# Patient Record
Sex: Female | Born: 1955 | Race: White | Hispanic: No | Marital: Married | Smoking: Former smoker
Health system: Southern US, Community
[De-identification: ages and names within clinical notes are randomized; demographics above are authoritative.]

## PROBLEM LIST (undated history)

## (undated) DIAGNOSIS — Z9889 Other specified postprocedural states: Secondary | ICD-10-CM

## (undated) DIAGNOSIS — M1990 Unspecified osteoarthritis, unspecified site: Secondary | ICD-10-CM

## (undated) DIAGNOSIS — R112 Nausea with vomiting, unspecified: Secondary | ICD-10-CM

## (undated) DIAGNOSIS — H04129 Dry eye syndrome of unspecified lacrimal gland: Secondary | ICD-10-CM

## (undated) DIAGNOSIS — Z87442 Personal history of urinary calculi: Secondary | ICD-10-CM

## (undated) DIAGNOSIS — I351 Nonrheumatic aortic (valve) insufficiency: Secondary | ICD-10-CM

## (undated) DIAGNOSIS — I2510 Atherosclerotic heart disease of native coronary artery without angina pectoris: Secondary | ICD-10-CM

## (undated) DIAGNOSIS — R011 Cardiac murmur, unspecified: Secondary | ICD-10-CM

## (undated) HISTORY — DX: Atherosclerotic heart disease of native coronary artery without angina pectoris: I25.10

## (undated) HISTORY — PX: WISDOM TOOTH EXTRACTION: SHX21

## (undated) HISTORY — PX: COLONOSCOPY: SHX174

---

## 1994-11-25 HISTORY — PX: SPLENECTOMY, TOTAL: SHX788

## 1994-11-25 HISTORY — PX: ABDOMINAL SURGERY: SHX537

## 2000-07-03 ENCOUNTER — Other Ambulatory Visit: Admission: RE | Admit: 2000-07-03 | Discharge: 2000-07-03 | Payer: Self-pay | Admitting: Obstetrics and Gynecology

## 2001-08-13 ENCOUNTER — Other Ambulatory Visit: Admission: RE | Admit: 2001-08-13 | Discharge: 2001-08-13 | Payer: Self-pay | Admitting: Obstetrics and Gynecology

## 2002-11-25 HISTORY — PX: ABDOMINAL HYSTERECTOMY: SHX81

## 2003-10-31 ENCOUNTER — Encounter: Admission: RE | Admit: 2003-10-31 | Discharge: 2003-10-31 | Payer: Self-pay | Admitting: Family Medicine

## 2003-11-07 ENCOUNTER — Other Ambulatory Visit: Admission: RE | Admit: 2003-11-07 | Discharge: 2003-11-07 | Payer: Self-pay | Admitting: Obstetrics and Gynecology

## 2003-11-24 ENCOUNTER — Encounter: Admission: RE | Admit: 2003-11-24 | Discharge: 2003-11-24 | Payer: Self-pay | Admitting: Orthopaedic Surgery

## 2005-01-22 ENCOUNTER — Other Ambulatory Visit: Admission: RE | Admit: 2005-01-22 | Discharge: 2005-01-22 | Payer: Self-pay | Admitting: Obstetrics and Gynecology

## 2005-06-11 ENCOUNTER — Observation Stay (HOSPITAL_COMMUNITY): Admission: RE | Admit: 2005-06-11 | Discharge: 2005-06-12 | Payer: Self-pay | Admitting: Obstetrics and Gynecology

## 2005-06-11 ENCOUNTER — Encounter (INDEPENDENT_AMBULATORY_CARE_PROVIDER_SITE_OTHER): Payer: Self-pay | Admitting: Specialist

## 2006-01-15 ENCOUNTER — Ambulatory Visit (HOSPITAL_COMMUNITY): Admission: RE | Admit: 2006-01-15 | Discharge: 2006-01-15 | Payer: Self-pay | Admitting: Chiropractic Medicine

## 2006-01-30 ENCOUNTER — Other Ambulatory Visit: Admission: RE | Admit: 2006-01-30 | Discharge: 2006-01-30 | Payer: Self-pay | Admitting: Obstetrics and Gynecology

## 2010-03-06 ENCOUNTER — Ambulatory Visit: Payer: Self-pay | Admitting: Cardiology

## 2010-03-06 DIAGNOSIS — R079 Chest pain, unspecified: Secondary | ICD-10-CM

## 2010-03-06 DIAGNOSIS — R011 Cardiac murmur, unspecified: Secondary | ICD-10-CM

## 2010-03-13 ENCOUNTER — Ambulatory Visit (HOSPITAL_COMMUNITY): Admission: RE | Admit: 2010-03-13 | Discharge: 2010-03-13 | Payer: Self-pay | Admitting: Cardiology

## 2010-03-13 ENCOUNTER — Encounter: Payer: Self-pay | Admitting: Cardiology

## 2010-03-13 ENCOUNTER — Ambulatory Visit: Payer: Self-pay

## 2010-03-13 ENCOUNTER — Ambulatory Visit: Payer: Self-pay | Admitting: Cardiovascular Disease

## 2010-12-25 NOTE — Assessment & Plan Note (Signed)
Summary: np6/ chestpain.sob; pt has bcbs/ gd   Visit Type:  new pt visit Referring Provider:  Dr. Arelia Sneddon Primary Provider:  No PCP at this time  CC:  LUQ cp....states pain is a squeezing feeling...pt also states he left great toe turns gray once in awhile..denies any sob or edema..  History of Present Illness: Mrs Levier is a 55 year old married white female referred by Dr. Arelia Sneddon for chest pain and palpitations.  She describes the chest discomfort is well localized over her left breast. It happened spontaneously is not related to exertion. It does not radiate to the neck jaw shoulder or arm. It is not associated with any nausea vomiting diaphoresis shortness of breath. It is not made worse by taking a deep breath.  She denies any gastroesophageal reflux or esophageal discomfort. She's had no dysphagia. She denies any fever, chills, cough, hemoptysis. She's had no chest trauma.  She has very few cardiac risk factors mostly remote tobacco and increasing age.  Current Medications (verified): 1)  Vitamin E 400 Unit Caps (Vitamin E) .Marland Kitchen.. 1 Cap Once Daily 2)  Zinc 25 Mg Tabs (Zinc) .Marland Kitchen.. 1 Tab Once Daily 3)  Multivitamins   Tabs (Multiple Vitamin) .Marland Kitchen.. 1 Tab Once Daily 4)  Instaflex- Joint Health .... 2 Caps Once Daily  Allergies (verified): 1)  ! Bonney Aid  Past History:  Past Medical History: CHEST PAIN (ICD-786.50)  Past Surgical History: hysterectomy..2009 mass removed from abdomen..1996  Review of Systems       negative other than history of present illness  Vital Signs:  Patient profile:   55 year old female Height:      65 inches Weight:      128 pounds BMI:     21.38 Pulse rate:   75 / minute Pulse rhythm:   irregular BP sitting:   138 / 82  (left arm) Cuff size:   large  Vitals Entered By: Danielle Rankin, CMA (March 06, 2010 11:54 AM)  Physical Exam  General:  Well developed, well nourished, in no acute distress. Head:  normocephalic and atraumatic Eyes:   PERRLA/EOM intact; conjunctiva and lids normal. Neck:  Neck supple, no JVD. No masses, thyromegaly or abnormal cervical nodes. Chest Jameis [place]sh[place]:  no deformities or breast masses noted Lungs:  Clear bilaterally to auscultation and percussion. Heart:  regular rate and rhythm, PMI nondisplaced, normal S1-S2, question click, soft systolic murmur at the apex. Abdomen:  Bowel sounds positive; abdomen soft and non-tender without masses, organomegaly, or hernias noted. No hepatosplenomegaly. Msk:  Back normal, normal gait. Muscle strength and tone normal. Pulses:  pulses normal in all 4 extremities Extremities:  No clubbing or cyanosis. Neurologic:  Alert and oriented x 3. Skin:  Intact without lesions or rashes. Psych:  Normal affect.   Problems:  Medical Problems Added: 1)  Dx of Cardiac Murmur  (ICD-785.2) 2)  Dx of Chest Pain  (ICD-786.50)  Impression & Recommendations:  Problem # 1:  CHEST PAIN (ICD-786.50) Assessment New This is clearly noncoronary. It could be related to possible prolapse would rule out by an echocardiogram. Her risk factors are are nominal and I reviewed those with her today. Statistical risk based on national data bases is less than 2% of having an acute coronary event in the next 10 years. Orders: EKG w/ Interpretation (93000) Echocardiogram (Echo)  Problem # 2:  CARDIAC MURMUR (ICD-785.2) I will obtain a 2-D echocardiogram rule out mitral valve prolapse. It showed on the model today what this might be and  could be associated for some of her symptomatology. If she has prolapse, has minimal regurg. SBE prophylaxis is not recommended. Reassurance given. Orders: Echocardiogram (Echo)  Patient Instructions: 1)  Your physician recommends that you schedule a follow-up appointment in: AS NEEDED 2)  Your physician recommends that you continue on your current medications as directed. Please refer to the Current Medication list given to you today. 3)  Your physician has  requested that you have an echocardiogram.  Echocardiography is a painless test that uses sound waves to create images of your heart. It provides your doctor with information about the size and shape of your heart and how well your heart's chambers and valves are working.  This procedure takes approximately one hour. There are no restrictions for this procedure.

## 2011-04-12 NOTE — Discharge Summary (Signed)
NAMECLAUDETT, Rachel Fleming                 ACCOUNT NO.:  [id number]   MEDICAL RECORD NO.:  [id number]          PATIENT TYPE:  OBV   LOCATION:  9311                          FACILITY:  [facility]   PHYSICIAN:  Juluis Mire, M.D.   DATE OF BIRTH:  08-May-1956   DATE OF ADMISSION:  06/11/2005  DATE OF DISCHARGE:                                 DISCHARGE SUMMARY   ADMITTING DIAGNOSIS:  Symptomatic uterine fibroids.   DISCHARGE DIAGNOSIS:  Symptomatic uterine fibroids.   OPERATIVE PROCEDURE:  Laparoscopic-assisted hysterectomy.   For complete history and physical please see dictated note.   COURSE IN THE HOSPITAL:  The patient underwent laparoscopic-assisted vaginal  hysterectomy. Postoperatively did have trouble with nausea and vomiting,  continued until the next morning. At that time she was tolerating some  liquids. Her abdomen was soft, bowel sounds were active. Her Foley had been  discontinued. She had voided without difficulty, was having no active  vaginal bleeding. Incisions were clear and abdominal exam was benign. Her  hemoglobin was 11.5.   We are going to observe her through the rest of the morning and see how she  does with her oral intake. If stable at this time will discharge home after  lunch.   COMPLICATIONS:  None encountered during her stay in the hospital. The  patient discharged home in stable condition.   DISPOSITION:  Routine postoperative instructions were given. She is to avoid  heavy lifting, vaginal entrance, or driving a car. Discharge home on  Percocet as she needs it for pain. She is to call with signs of infection,  nausea, vomiting, increase in abdominal pain, or active vaginal bleeding.  Follow-up in the office should be in 1 week.       JSM/MEDQ  D:  06/12/2005  T:  06/12/2005  Job:  [id number]

## 2011-04-12 NOTE — H&P (Signed)
NAME:  Rachel Fleming, Rachel Fleming                 ACCOUNT NO.:  [id number]   MEDICAL RECORD NO.:  [id number]          PATIENT TYPE:  AMB   LOCATION:  SDC                           FACILITY:  [facility]   PHYSICIAN:  Juluis Mire, M.D.   DATE OF BIRTH:  May 06, 1956   DATE OF ADMISSION:  DATE OF DISCHARGE:                                HISTORY & PHYSICAL   The patient is a 55 year old gravida 1, para 0, AB-1, female who presents  for attempt at laparoscopic assisted vaginal hysterectomy for management of  enlarging symptomatic uterine fibroids.   The patient has had a long history of uterine fibroids. They had been  relatively asymptomatic and managed conservatively. However the patient  began to have increasing pelvic pain and discomfort and found that she was  having definite enlargement of uterine fibroids. She had a 4 cm intramural  posterior wall fibroid, and a left fundal apparent pedunculated fibroid  measuring approximately 6.8 to 7 cm. She was having increasing left lower  quadrant discomfort and again the only findings that we could come up with  was the enlarging fibroids. In view of this the patient has decided to  proceed with attempt at surgery with a laparoscopic assisted vaginal  hysterectomy with possible total abdominal hysterectomy. She does wish her  ovaries to be conserved. We have discussed with her the risk of ovarian  cancer of 1 in 60, and the difficulty in detection of this, still she wants  the ovaries to be conserved.   ALLERGIES:  No known drug allergies.   MEDICATIONS:  Birth control pills in form of Ortho-Cyclen.   PAST MEDICAL HISTORY:  Usual childhood diseases.   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 She has had previous laser  ablation of vulvar intraepithelial neoplasia. She has had previous  cryotherapy at the cervix.   FAMILY HISTORY:  Noncontributory.   SOCIAL HISTORY:  Reveals no  tobacco or alcohol use.   REVIEW OF SYSTEMS:  Noncontributory.   PHYSICAL EXAMINATION:  VITAL SIGNS: The patient is afebrile with stable  vital signs.  HEENT EXAM: Normocephalic. Pupils are equal, round and reactive to light and  accommodation. Extraocular movements are intact. Sclerae and conjunctivae  clear. Oropharynx clear.  NECK: Without thyromegaly.  BREASTS:  No discreet masses.  LUNGS:  Clear.  CARDIOVASCULAR:  Regular rate and rhythm without murmurs or gallops.  ABDOMEN:  Midline incision, no masses, organomegaly and minimal tenderness.  PELVIC:  Normal external genitalia. Vaginal mucosa is clear. Cervix is  unremarkable. Uterus is enlarged. This is consistent with the known uterine  fibroids. She has a dominant fibroid on the left that is consisted with the  pedunculated fibroid and seems to be the source of pain. Rectovaginal exam  is clear.  EXTREMITIES:  Trace edema.  NEUROLOGIC EXAM:  Grossly within normal limits.   IMPRESSION:  1.  Enlarging uterine fibroids with associated symptomatology  2.  History of removal of a neural blastoma.   PLAN:  We have discussed the options with the patient.  She has decided on  surgical options. The risks of surgery have been discussed including the  risk of infection. The risk of hemorrhage that could require transfusion,  the risk of AIDS or hepatitis. The risk of injury to adjacent organs  including bladder, bowel, or ureters that could require further exploratory  surgery. The risk of deep venous thrombosis and pulmonary embolus. The  patient does under the potential need for an abdominal incision. In view of  her previous surgery and the large fibroid. We have also discussed again the  risk of malignant transformation if the ovaries are conserved.       JSM/MEDQ  D:  06/11/2005  T:  06/11/2005  Job:  [id number]

## 2011-04-12 NOTE — Op Note (Signed)
Rachel Fleming, Rachel Fleming                 ACCOUNT NO.:  [id number]   MEDICAL RECORD NO.:  [id number]          PATIENT TYPE:  OBV   LOCATION:  9311                          FACILITY:  [facility]   PHYSICIAN:  Juluis Mire, M.D.   DATE OF BIRTH:  1956/04/29   DATE OF PROCEDURE:  06/11/2005  DATE OF DISCHARGE:                                 OPERATIVE REPORT   PREOPERATIVE DIAGNOSIS:  Uterine fibroids.   POSTOPERATIVE DIAGNOSIS:  Uterine fibroids.   OPERATION/PROCEDURE:  Laparoscopic-assisted vaginal hysterectomy.   SURGEON:  Juluis Mire, M.D.   ASSISTANT:  Dineen Kid. Rana Snare, M.D.   ANESTHESIA:  General endotracheal anesthesia.   ESTIMATED BLOOD LOSS:  300-400 mL.   PACKS:  None.   DRAINS:  None.   INTRAOPERATIVE BLOOD REPLACEMENT:  None.   COMPLICATIONS:  None.   INDICATIONS:  As noted in the history and physical.   DESCRIPTION OF PROCEDURE:  The patient was taken to the OR, placed in the  supine position.  After satisfactory level of general endotracheal  anesthesia was obtained, the patient was placed in the dorsal lithotomy  position using stirrups.  The abdomen and perineum and vagina were cleansed  out with a non-banded dye and surgical scrub.  A Foley was placed to  straight drainage.  Adequate amount of clear urine.  Hulka tenaculum was put  in placed and secured.  The patient was then draped in a sterile field.   Subumbilical incision was made with the knife and carried through the  subcutaneous tissue.  Fascia was entered sharply and incision extended  laterally.  Peritoneum was entered bluntly with  a finger.  A taut open  laparoscopic trocar was put in place and secured.  Abdomen was inflated with  carbon dioxide.  A 5 mm trocar was put in place in the suprapubic area under  direct visualization.  Visualization revealed the uterus to have one large  anterior wall fibroid and a large pedunculated fibroid coming off  the left  fundal area.  The right ovary had one spot of  endometriosis.  The left ovary  was unremarkable.  Cul-de-sac was clear.  There was some omental adhesions  to the anterior abdominal wall from the previous exploratory laparotomy to  remove her neuroblastoma.  Upper abdomen including the liver and tip of the  gallbladder and both lateral gutters were clear.   Next, using the Gyrus bipolar cauterized and incised the right utero-ovarian  pedicle, right tube and mesosalpinx and right round ligament.  We continued  the dissection down in the broad ligament.  We then went to left side,  deviating the uterus and elevating it.  We identified the left utero-ovarian  pedicle, cauterizing and incised it.  The left tube and mesosalpinx were  cauterized, incised, the left round ligament was cauterized and incised and  the procedure was continued into the broad ligament.  We then developed a  bladder flap with the Gyrus.  We had good hemostasis and we felt like we had  the uterus freed up adequately to go from below.  The abdomen  was deflated  with carbon dioxide.  Laparoscope was removed.  Legs were repositioned.  Weighted speculum was placed in the vaginal vault.  We attempted to grasp  the cervix with the [facility] tenaculum.  We attempted to enter the cul-de-sac.  We were unsuccessful.  We identified the uterosacral ligaments and clamped,  cut and suture ligated these.  Reflection of the vaginal mucosa anteriorly  was incised using the Bovie and the bladder was dissected superiorly.  Paracervical tissue was clamped, cut and suture ligated with 0 Vicryl.  Cul-  de-sac was then identified, entered sharply.  We further dissected the  bladder superiorly and identified the vesicouterine space.  Using the clamp,  cut and tie technique with a suture ligature of 0 Vicryl, the parametrium  was serially separated from the sides of the uterus.  At this point in time  we encountered the anterior wall fibroid.  We decided to morcellate.  The  fibroid was on the  right side.  We grasped it with a thyroid tenaculum and  excised the cervix and the lower uterine segment.  We then identified the  fibroid and were able to morcellate it out.  With this the uterine fundus  proceeded to come out.  We had two pedicles in each side which were still  intact and these were clamped and cut.  The remaining uterus and the  associated pedunculated fibroids were removed intact and sent for  pathological review.  Held pedicle secured with free tie of 0 Vicryl.  Moistened sponge on a sponge stick was placed vaginally.  Bleeding from the  lower cuff brought under control with figure-of-eights of 0 Vicryl.  Vaginal  cuff was then reapproximated in the midline with interrupted sutures of 0  Vicryl.  We had good hemostasis and clear urine output.  Sponge on sponge  stick was placed in the vaginal vault.  Weighted speculum was then removed.   Abdomen was reinflated with carbon dioxide.  Laparoscope was reintroduced.  Minimal bleeding was noted at the vaginal cuff and brought under control  with the Gyrus.  Both ovarian pedicles were hemostatically intact. Right  ovary had one spot of endometriosis, cauterized.  Ovaries were otherwise  completely unremarkable.  Irrigated the pelvis.  Irrigation was removed.  Again, we had good hemostasis.  Abdomen was deflated with carbon dioxide.  All trocars were removed.  Subumbilical fascia was closed with two figure-of-  eights with 0 Vicryl.  Skin was closed with interrupted subcuticular 4-0  Vicryl.  Suprapubic incision was closed with Dermabond.  Sponge on a sponge  stick was removed from the vaginal vault.  The patient was taken out of the  dorsal lithotomy position, once alert and extubated, transferred to the  recovery room in good condition.  Sponge, instrument and needle counts  reported as correct by the circulating nurse x2.      JSM/MEDQ  D:  06/11/2005  T:  06/11/2005  Job:  [id number]

## 2011-09-24 ENCOUNTER — Telehealth: Payer: Self-pay | Admitting: Cardiology

## 2011-09-24 DIAGNOSIS — R011 Cardiac murmur, unspecified: Secondary | ICD-10-CM

## 2011-09-24 NOTE — Telephone Encounter (Signed)
Pt last seen by Dr Daleen Squibb 4/11 and she did have a 2 D Echo at that time.  Dr Vern Claude note states she will be seen back as needed.  Will forward to MD for review and any orders.

## 2011-09-24 NOTE — Telephone Encounter (Signed)
Pt had test last year and was told to have again this year but not sure what test it was, sounds like an echo, no order, does dr wall want to order? And pt has new insurance, need to send to [place] for precert if pt needs test, [employer] [phone] member # [id number] grp [id number] timothy g Hillock [birth year]

## 2011-09-26 NOTE — Telephone Encounter (Signed)
lmovm billing code for pt to check with her insurance about coverage of echo. I will mail out copy today of form to pt. Mylo Red RN

## 2011-09-26 NOTE — Telephone Encounter (Signed)
I spoke with pt and have scheduled her for an ECHO Dx: AI and follow-up appt with Dr. Daleen Squibb.  This is per Dr. Vern Claude note on 4/11 ECHO.

## 2011-10-02 ENCOUNTER — Other Ambulatory Visit (HOSPITAL_COMMUNITY): Payer: Self-pay | Admitting: Radiology

## 2011-10-11 ENCOUNTER — Ambulatory Visit: Payer: Self-pay | Admitting: Cardiology

## 2011-11-13 ENCOUNTER — Other Ambulatory Visit: Payer: Self-pay | Admitting: Dermatology

## 2011-12-05 ENCOUNTER — Ambulatory Visit (HOSPITAL_COMMUNITY): Payer: Managed Care, Other (non HMO) | Attending: Cardiology | Admitting: Radiology

## 2011-12-05 DIAGNOSIS — R011 Cardiac murmur, unspecified: Secondary | ICD-10-CM | POA: Insufficient documentation

## 2011-12-05 DIAGNOSIS — R079 Chest pain, unspecified: Secondary | ICD-10-CM | POA: Insufficient documentation

## 2011-12-11 ENCOUNTER — Encounter: Payer: Self-pay | Admitting: *Deleted

## 2011-12-12 ENCOUNTER — Ambulatory Visit (INDEPENDENT_AMBULATORY_CARE_PROVIDER_SITE_OTHER): Payer: Managed Care, Other (non HMO) | Admitting: Cardiology

## 2011-12-12 ENCOUNTER — Encounter: Payer: Self-pay | Admitting: Cardiology

## 2011-12-12 VITALS — BP 118/82 | HR 74 | Ht 65.0 in | Wt 134.0 lb

## 2011-12-12 DIAGNOSIS — R002 Palpitations: Secondary | ICD-10-CM

## 2011-12-12 DIAGNOSIS — R011 Cardiac murmur, unspecified: Secondary | ICD-10-CM

## 2011-12-12 NOTE — Assessment & Plan Note (Signed)
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 With normal left ventricular systolic function we will not specifically treat these.

## 2011-12-12 NOTE — Patient Instructions (Signed)
Your physician wants you to follow-up in: 2 years with dr. Daleen Squibb AND repeat echo. You will receive a reminder letter in the mail two months in advance. If you don't receive a letter, please call our office to schedule the follow-up appointment.  Your physician has requested that you have an echocardiogram. Echocardiography is a painless test that uses sound waves to create images of your heart. It provides your doctor with information about the size and shape of your heart and how well your heart's chambers and valves are working. This procedure takes approximately one hour. There are no restrictions for this procedure. January 2015

## 2011-12-12 NOTE — Progress Notes (Signed)
HPI Mrs. Nobile comes in today for followup of her history of mild mitral regurgitation and mild aortic regurgitation.  I initially saw her a couple years ago for chest pain which I felt was noncardiac. I thought she could have mitral prolapse but doubted. Echocardiogram showed mild thickening of the mitral valve is no prolapse. She also has mild thickening of the aortic valve.  She has occasional palpitations but they are unpredictable and infrequent. She denies any chest pain, dyspnea on exertion, orthopnea, PND or edema.  Echocardiogram this past week shows stable findings.  Past Medical History  Diagnosis Date  . History of chest pain   . History of hysterectomy 2009    Current Outpatient Prescriptions  Medication Sig Dispense Refill  . multivitamin (THERAGRAN) per tablet Take 1 tablet by mouth daily.      . NON FORMULARY instaflex joint health,     2 capsules daily      . vitamin E 400 UNIT capsule Take 400 Units by mouth daily.      . Zinc 25 MG TABS Take by mouth.        Allergies  Allergen Reactions  . Betadine (Povidone Iodine)     No family history on file.  History   Social History  . Marital Status: Married    Spouse Name: N/A    Number of Children: N/A  . Years of Education: N/A   Occupational History  . Not on file.   Social History Main Topics  . Smoking status: Never Smoker   . Smokeless tobacco: Never Used  . Alcohol Use: No  . Drug Use: No  . Sexually Active: Not on file   Other Topics Concern  . Not on file   Social History Narrative  . No narrative on file    ROS ALL NEGATIVE EXCEPT THOSE NOTED IN HPI  PE  General Appearance: well developed, well nourished in no acute distress HEENT: symmetrical face, PERRLA, good dentition  Neck: no JVD, thyromegaly, or adenopathy, trachea midline Chest: symmetric without deformity Cardiac: PMI non-displaced, RRR, normal S1, S2, no gallop, soft systolic murmur at the apex, soft diastolic murmur left  upper sternal border and Lung: clear to ausculation and percussion Vascular: all pulses full without bruits  Abdominal: nondistended, nontender, good bowel sounds, no HSM, no bruits Extremities: no cyanosis, clubbing or edema, no sign of DVT, no varicosities  Skin: normal color, no rashes Neuro: alert and oriented x 3, non-focal Pysch: normal affect  EKG Normal sinus rhythm with occasional PVC. BMET No results found for this basename: na, k, cl, co2, glucose, bun, creatinine, calcium, gfrnonaa, gfraa    Lipid Panel  No results found for this basename: chol, trig, hdl, cholhdl, vldl, ldlcalc    CBC No results found for this basename: wbc, rbc, hgb, hct, plt, mcv, mch, mchc, rdw, neutrabs, lymphsabs, monoabs, eosabs, basosabs

## 2011-12-12 NOTE — Assessment & Plan Note (Signed)
She is mild regurgitation of the aortic and mitral valves. I have recommended she have a 2-D echocardiogram in 2 years. We explained this very carefully using a heart model.

## 2011-12-26 ENCOUNTER — Telehealth: Payer: Self-pay | Admitting: Cardiology

## 2013-12-01 ENCOUNTER — Other Ambulatory Visit: Payer: Self-pay | Admitting: Dermatology

## 2014-01-12 ENCOUNTER — Other Ambulatory Visit: Payer: Self-pay | Admitting: Dermatology

## 2014-12-30 ENCOUNTER — Other Ambulatory Visit: Payer: Self-pay | Admitting: Orthopedic Surgery

## 2015-01-02 ENCOUNTER — Other Ambulatory Visit: Payer: Self-pay | Admitting: Dermatology

## 2015-07-25 ENCOUNTER — Other Ambulatory Visit: Payer: Self-pay | Admitting: Obstetrics and Gynecology

## 2015-07-28 LAB — CYTOLOGY - PAP

## 2015-08-14 ENCOUNTER — Other Ambulatory Visit: Payer: Self-pay | Admitting: Obstetrics and Gynecology

## 2015-08-17 LAB — CYTOLOGY - PAP

## 2015-11-26 DIAGNOSIS — Z87442 Personal history of urinary calculi: Secondary | ICD-10-CM

## 2015-11-26 HISTORY — DX: Personal history of urinary calculi: Z87.442

## 2016-03-20 ENCOUNTER — Emergency Department (HOSPITAL_BASED_OUTPATIENT_CLINIC_OR_DEPARTMENT_OTHER)
Admission: EM | Admit: 2016-03-20 | Discharge: 2016-03-20 | Disposition: A | Discharge status: Home / Self Care | Payer: Managed Care, Other (non HMO) | Attending: Emergency Medicine | Admitting: Emergency Medicine

## 2016-03-20 ENCOUNTER — Encounter (HOSPITAL_BASED_OUTPATIENT_CLINIC_OR_DEPARTMENT_OTHER): Payer: Self-pay | Admitting: *Deleted

## 2016-03-20 ENCOUNTER — Emergency Department (HOSPITAL_BASED_OUTPATIENT_CLINIC_OR_DEPARTMENT_OTHER): Payer: Managed Care, Other (non HMO)

## 2016-03-20 DIAGNOSIS — R42 Dizziness and giddiness: Secondary | ICD-10-CM | POA: Insufficient documentation

## 2016-03-20 DIAGNOSIS — N201 Calculus of ureter: Secondary | ICD-10-CM | POA: Insufficient documentation

## 2016-03-20 DIAGNOSIS — R112 Nausea with vomiting, unspecified: Secondary | ICD-10-CM | POA: Insufficient documentation

## 2016-03-20 DIAGNOSIS — R109 Unspecified abdominal pain: Secondary | ICD-10-CM | POA: Diagnosis present

## 2016-03-20 DIAGNOSIS — N23 Unspecified renal colic: Secondary | ICD-10-CM

## 2016-03-20 LAB — URINALYSIS, ROUTINE W REFLEX MICROSCOPIC
Bilirubin Urine: NEGATIVE
Glucose, UA: 100 mg/dL — AB
Hgb urine dipstick: NEGATIVE
Ketones, ur: NEGATIVE mg/dL
Leukocytes, UA: NEGATIVE
Nitrite: NEGATIVE
Protein, ur: NEGATIVE mg/dL
Specific Gravity, Urine: 1.015 (ref 1.005–1.030)
pH: 8 (ref 5.0–8.0)

## 2016-03-20 LAB — COMPREHENSIVE METABOLIC PANEL
ALT: 18 U/L (ref 14–54)
AST: 31 U/L (ref 15–41)
Albumin: 4.4 g/dL (ref 3.5–5.0)
Alkaline Phosphatase: 83 U/L (ref 38–126)
Anion gap: 4 — ABNORMAL LOW (ref 5–15)
BUN: 28 mg/dL — ABNORMAL HIGH (ref 6–20)
CO2: 24 mmol/L (ref 22–32)
Calcium: 9.2 mg/dL (ref 8.9–10.3)
Chloride: 105 mmol/L (ref 101–111)
Creatinine, Ser: 0.69 mg/dL (ref 0.44–1.00)
GFR calc Af Amer: >60 mL/min (ref >60)
GFR calc non Af Amer: >60 mL/min (ref >60)
Glucose, Bld: 132 mg/dL — ABNORMAL HIGH (ref 65–99)
Potassium: 3.6 mmol/L (ref 3.5–5.1)
Sodium: 133 mmol/L — ABNORMAL LOW (ref 135–145)
Total Bilirubin: 0.9 mg/dL (ref 0.3–1.2)
Total Protein: 7.1 g/dL (ref 6.5–8.1)

## 2016-03-20 LAB — CBC
HCT: 38.3 % (ref 36.0–46.0)
Hemoglobin: 13.2 g/dL (ref 12.0–15.0)
MCH: 32.4 pg (ref 26.0–34.0)
MCHC: 34.5 g/dL (ref 30.0–36.0)
MCV: 93.9 fL (ref 78.0–100.0)
Platelets: 327 10*3/uL (ref 150–400)
RBC: 4.08 MIL/uL (ref 3.87–5.11)
RDW: 13.1 % (ref 11.5–15.5)
WBC: 14 10*3/uL — ABNORMAL HIGH (ref 4.0–10.5)

## 2016-03-20 LAB — LIPASE, BLOOD: Lipase: 22 U/L (ref 11–51)

## 2016-03-20 MED ORDER — KETOROLAC TROMETHAMINE 30 MG/ML IJ SOLN
30.0000 mg | Freq: Once | INTRAMUSCULAR | Status: AC
  Administered 2016-03-20: 30 mg via INTRAVENOUS
  Filled 2016-03-20: qty 1

## 2016-03-20 MED ORDER — ONDANSETRON HCL 4 MG/2ML IJ SOLN
4.0000 mg | Freq: Once | INTRAMUSCULAR | Status: AC
  Administered 2016-03-20: 4 mg via INTRAVENOUS
  Filled 2016-03-20: qty 2

## 2016-03-20 MED ORDER — TAMSULOSIN HCL 0.4 MG PO CAPS
0.4000 mg | ORAL_CAPSULE | Freq: Once | ORAL | Status: AC
  Administered 2016-03-20: 0.4 mg via ORAL
  Filled 2016-03-20: qty 1

## 2016-03-20 MED ORDER — ONDANSETRON 8 MG PO TBDP: 8.0000 mg | ORAL_TABLET | Freq: Three times a day (TID) | ORAL | Status: DC | PRN

## 2016-03-20 MED ORDER — HYDROMORPHONE HCL 1 MG/ML IJ SOLN
1.0000 mg | Freq: Once | INTRAMUSCULAR | Status: AC
  Administered 2016-03-20: 1 mg via INTRAVENOUS
  Filled 2016-03-20: qty 1

## 2016-03-20 MED ORDER — ONDANSETRON HCL 4 MG/2ML IJ SOLN
4.0000 mg | Freq: Once | INTRAMUSCULAR | Status: AC | PRN
  Administered 2016-03-20: 4 mg via INTRAVENOUS
  Filled 2016-03-20: qty 2

## 2016-03-20 MED ORDER — TAMSULOSIN HCL 0.4 MG PO CAPS: 0.4000 mg | ORAL_CAPSULE | Freq: Every day | ORAL | Status: DC

## 2016-03-20 MED ORDER — HYDROMORPHONE HCL 1 MG/ML IJ SOLN
0.5000 mg | Freq: Once | INTRAMUSCULAR | Status: AC
Start: 2016-03-20 — End: 2016-03-20
  Administered 2016-03-20: 0.5 mg via INTRAVENOUS
  Filled 2016-03-20: qty 1

## 2016-03-20 MED ORDER — HYDROCODONE-ACETAMINOPHEN 5-325 MG PO TABS: 1.0000 | ORAL_TABLET | Freq: Four times a day (QID) | ORAL | Status: DC | PRN

## 2016-03-20 NOTE — ED Notes (Signed)
Pt c/o left lower abd pain with n/v x 12 hrs, seen by PMD today  ? Kidney stones , hematuria

## 2016-03-20 NOTE — Discharge Instructions (Signed)
It was our pleasure to provide your ER care today - we hope that you feel better.  Rest. Drink adequate fluids.  Strain urine.  Take motrin or aleve as need for pain. You may also take hydrocodone as need for pain. No driving when taking hydrocodone. Also, do not take tylenol or acetaminophen containing medication when taking hydrocodone.  Take flomax as prescribed.   Take zofran as need for nausea.  Follow up with urologist in the next couple days if symptoms fail to improve/resolve - see referral - call office to arrange appointment.  Return to [facility] ER if worse, intractable pain, persistent vomiting, fevers, other concern.  You were given pain medication in the ER - no driving for the next 4 hours.    Kidney Stones Kidney stones (urolithiasis) are deposits that form inside your kidneys. The intense pain is caused by the stone moving through the urinary tract. When the stone moves, the ureter goes into spasm around the stone. The stone is usually passed in the urine.  CAUSES   A disorder that makes certain neck glands produce too much parathyroid hormone (primary hyperparathyroidism).  A buildup of uric acid crystals, similar to gout in your joints.  Narrowing (stricture) of the ureter.  A kidney obstruction present at birth (congenital obstruction).  Previous surgery on the kidney or ureters.  Numerous kidney infections. SYMPTOMS   Feeling sick to your stomach (nauseous).  Throwing up (vomiting).  Blood in the urine (hematuria).  Pain that usually spreads (radiates) to the groin.  Frequency or urgency of urination. DIAGNOSIS   Taking a history and physical exam.  Blood or urine tests.  CT scan.  Occasionally, an examination of the inside of the urinary bladder (cystoscopy) is performed. TREATMENT   Observation.  Increasing your fluid intake.  Extracorporeal shock wave lithotripsy--This is a noninvasive procedure that uses shock waves to break up  kidney stones.  Surgery may be needed if you have severe pain or persistent obstruction. There are various surgical procedures. Most of the procedures are performed with the use of small instruments. Only small incisions are needed to accommodate these instruments, so recovery time is minimized. The size, location, and chemical composition are all important variables that will determine the proper choice of action for you. Talk to your health care provider to better understand your situation so that you will minimize the risk of injury to yourself and your kidney.  HOME CARE INSTRUCTIONS   Drink enough water and fluids to keep your urine clear or pale yellow. This will help you to pass the stone or stone fragments.  Strain all urine through the provided strainer. Keep all particulate matter and stones for your health care provider to see. The stone causing the pain may be as small as a grain of salt. It is very important to use the strainer each and every time you pass your urine. The collection of your stone will allow your health care provider to analyze it and verify that a stone has actually passed. The stone analysis will often identify what you can do to reduce the incidence of recurrences.  Only take over-the-counter or prescription medicines for pain, discomfort, or fever as directed by your health care provider.  Keep all follow-up visits as told by your health care provider. This is important.  Get follow-up X-rays if required. The absence of pain does not always mean that the stone has passed. It may have only stopped moving. If the urine remains completely  obstructed, it can cause loss of kidney function or even complete destruction of the kidney. It is your responsibility to make sure X-rays and follow-ups are completed. Ultrasounds of the kidney can show blockages and the status of the kidney. Ultrasounds are not associated with any radiation and can be performed easily in a matter of  minutes.  Make changes to your daily diet as told by your health care provider. You may be told to:  Limit the amount of salt that you eat.  Eat 5 or more servings of fruits and vegetables each day.  Limit the amount of meat, poultry, fish, and eggs that you eat.  Collect a 24-hour urine sample as told by your health care provider.You may need to collect another urine sample every 6-12 months. SEEK MEDICAL CARE IF:  You experience pain that is progressive and unresponsive to any pain medicine you have been prescribed. SEEK IMMEDIATE MEDICAL CARE IF:   Pain cannot be controlled with the prescribed medicine.  You have a fever or shaking chills.  The severity or intensity of pain increases over 18 hours and is not relieved by pain medicine.  You develop a new onset of abdominal pain.  You feel faint or pass out.  You are unable to urinate.   This information is not intended to replace advice given to you by your health care provider. Make sure you discuss any questions you have with your health care provider.   Document Released: 11/11/2005 Document Revised: 08/02/2015 Document Reviewed: 04/14/2013 Elsevier Interactive Patient Education [employer].

## 2016-03-20 NOTE — ED Provider Notes (Addendum)
CSN: [id number]     Arrival date & time 03/20/16  2004 History  By signing my name below, I, Rachel Fleming, attest that this documentation has been prepared under the direction and in the presence of Lajean Saver, MD. Electronically Signed: Irene Fleming, ED Scribe. 03/20/2016. 9:23 PM.   Chief Complaint  Patient presents with  . Abdominal Pain   Patient is a 60 y.o. female presenting with abdominal pain. The history is provided by the patient. No language interpreter was used.  Abdominal Pain Pain location:  LLQ Pain quality: sharp   Pain radiates to:  Does not radiate Pain severity:  Moderate Onset quality:  Sudden Duration:  12 hours Timing:  Constant Progression:  Worsening Chronicity:  New Ineffective treatments: hydrocodone. Associated symptoms: nausea and vomiting   Associated symptoms: no chills, no dysuria, no fever, no hematuria, no shortness of breath and no sore throat   HPI Comments: Rachel Fleming is a 60 y.o. female with a hx of hysterectomy who presents to the Emergency Department complaining of sudden onset, gradually worsening LLQ abdominal pain onset 12 hours ago. Pain constant, mod-severe, non radiating, no specific exacerbating or allev factors. Pt reports that she was at work when the pain began and reports associated lightheadedness. Pt states that she was seen by her PCP today, who suspected that the pt may have kidney stones. Labs were performed, and showed a scant amount of blood in the urine. She states that the pain progressively worsened after leaving her PCP's office with nausea and vomiting. Pt took hydrocodone for pain to no relief, because she threw it up. She denies hx of similar symptoms, fever, chills, dysuria, hematuria, or vaginal bleeding.      Past Medical History  Diagnosis Date  . History of chest pain   . History of hysterectomy 2009   Past Surgical History  Procedure Laterality Date  . Abdominal surgery  1996    mass remaved  .  Abdominal hysterectomy     History reviewed. No pertinent family history. Social History  Substance Use Topics  . Smoking status: Never Smoker   . Smokeless tobacco: Never Used  . Alcohol Use: No   OB History    No data available     Review of Systems  Constitutional: Negative for fever and chills.  HENT: Negative for sore throat.   Eyes: Negative for redness.  Respiratory: Negative for shortness of breath.   Gastrointestinal: Positive for nausea, vomiting and abdominal pain.       Hx chronic constipation. No recent change. No distension, last bm couple days ago.   Endocrine: Negative for polyuria.  Genitourinary: Positive for flank pain. Negative for dysuria and hematuria.  Musculoskeletal: Negative for neck pain.  Skin: Negative for rash.  Neurological: Positive for light-headedness.  Hematological: Negative for adenopathy.  Psychiatric/Behavioral: Negative for confusion.  All other systems reviewed and are negative.  Allergies  Betadine  Home Medications   Prior to Admission medications   Medication Sig Start Date End Date Taking? Authorizing Provider  HYDROcodone-acetaminophen (NORCO/VICODIN) 5-325 MG tablet Take 1 tablet by mouth every 6 (six) hours as needed for moderate pain.   Yes Historical Provider, MD  multivitamin [facility]) per tablet Take 1 tablet by mouth daily.    Historical Provider, MD  NON FORMULARY instaflex joint health,     2 capsules daily    Historical Provider, MD  vitamin E 400 UNIT capsule Take 400 Units by mouth daily.    Historical Provider, MD  Zinc 25 MG TABS Take by mouth.    Historical Provider, MD   BP 147/86 mmHg  Pulse 103  Temp(Src) 98.9 F (37.2 C) (Oral)  Resp 16  Ht 5\' 5"  (1.651 m)  Wt 125 lb (56.7 kg)  BMI 20.80 kg/m2 Physical Exam  Constitutional: She appears well-developed and well-nourished. No distress.  HENT:  Mouth/Throat: Oropharynx is clear and moist.  Eyes: Conjunctivae are normal. No scleral icterus.  Neck:  Neck supple.  Cardiovascular: Intact distal pulses.   Pulmonary/Chest: Effort normal and breath sounds normal. She has no wheezes.  Abdominal: Soft. Bowel sounds are normal. She exhibits no distension and no mass. There is no tenderness. There is no rebound and no guarding.  Genitourinary:  No cva tenderness  Musculoskeletal: She exhibits no edema.  Neurological: She is alert.  Skin: Skin is warm and dry. She is not diaphoretic.  Psychiatric: She has a normal mood and affect.  Nursing note and vitals reviewed.   ED Course  Procedures (including critical care time) DIAGNOSTIC STUDIES: Oxygen Saturation is 98% on RA, normal by my interpretation.    COORDINATION OF CARE: 9:22 PM-Discussed treatment plan which includes labs and CT with pt at bedside and pt agreed to plan.    Labs Review   Results for orders placed or performed during the hospital encounter of 03/20/16  Urinalysis, Routine w reflex microscopic (not at [facility])  Result Value Ref Range   Color, Urine YELLOW YELLOW   APPearance CLEAR CLEAR   Specific Gravity, Urine 1.015 1.005 - 1.030   pH 8.0 5.0 - 8.0   Glucose, UA 100 (A) NEGATIVE mg/dL   Hgb urine dipstick NEGATIVE NEGATIVE   Bilirubin Urine NEGATIVE NEGATIVE   Ketones, ur NEGATIVE NEGATIVE mg/dL   Protein, ur NEGATIVE NEGATIVE mg/dL   Nitrite NEGATIVE NEGATIVE   Leukocytes, UA NEGATIVE NEGATIVE  Lipase, blood  Result Value Ref Range   Lipase 22 11 - 51 U/L  Comprehensive metabolic panel  Result Value Ref Range   Sodium 133 (L) 135 - 145 mmol/L   Potassium 3.6 3.5 - 5.1 mmol/L   Chloride 105 101 - 111 mmol/L   CO2 24 22 - 32 mmol/L   Glucose, Bld 132 (H) 65 - 99 mg/dL   BUN 28 (H) 6 - 20 mg/dL   Creatinine, Ser 0.69 0.44 - 1.00 mg/dL   Calcium 9.2 8.9 - 10.3 mg/dL   Total Protein 7.1 6.5 - 8.1 g/dL   Albumin 4.4 3.5 - 5.0 g/dL   AST 31 15 - 41 U/L   ALT 18 14 - 54 U/L   Alkaline Phosphatase 83 38 - 126 U/L   Total Bilirubin 0.9 0.3 - 1.2 mg/dL   GFR  calc non Af Amer >60 >60 mL/min   GFR calc Af Amer >60 >60 mL/min   Anion gap 4 (L) 5 - 15  CBC  Result Value Ref Range   WBC 14.0 (H) 4.0 - 10.5 K/uL   RBC 4.08 3.87 - 5.11 MIL/uL   Hemoglobin 13.2 12.0 - 15.0 g/dL   HCT 38.3 36.0 - 46.0 %   MCV 93.9 78.0 - 100.0 fL   MCH 32.4 26.0 - 34.0 pg   MCHC 34.5 30.0 - 36.0 g/dL   RDW 13.1 11.5 - 15.5 %   Platelets 327 150 - 400 K/uL   Ct Abdomen Pelvis Wo Contrast  03/20/2016  CLINICAL DATA:  Patient with left lower quadrant pain.  Hematuria. EXAM: CT ABDOMEN AND PELVIS WITHOUT CONTRAST  TECHNIQUE: Multidetector CT imaging of the abdomen and pelvis was performed following the standard protocol without IV contrast. COMPARISON:  None. FINDINGS: Lower chest: Normal heart size. Dependent atelectasis within the bilateral lower lobes. No pleural effusion. Hepatobiliary: Liver is normal in size and contour. Gallbladder is unremarkable. Pancreas: Unremarkable Spleen: Spleen is surgically absent. Adrenals/Urinary Tract: The adrenal glands are unremarkable. There is moderate left hydroureteronephrosis to the level of the distal left ureter were there is an obstructing 3 mm stone (image 76; series 2). Urinary bladder is unremarkable. Multiple punctate calcific densities within the left renal hilum raising possibility of medullary nephrocalcinosis. There are multiple punctate calcific densities within the inferior pole of the right kidney. Focal atrophy inferior pole right kidney. Fluid and fat stranding about the left kidney. Stomach/Bowel: There is a large amount of stool throughout the colon. No evidence for bowel obstruction. Normal morphology of the stomach. No free fluid or free intraperitoneal air. Vascular/Lymphatic: Normal caliber abdominal aorta. No retroperitoneal lymphadenopathy. Other: Uterus is surgically absent. Multiple prominent follicles within the left ovary. Musculoskeletal: Lumbar spine degenerative changes. No aggressive or acute appearing osseous  lesions. IMPRESSION: Obstructing 3 mm stone within the distal left ureter resulting in moderate left hydroureteronephrosis. This multiple punctate calcific densities within the left-greater-than-right renal sinuses raising the possibility of medullary nephrocalcinosis. Large amount of stool throughout the colon, most notable within the cecum and ascending colon, concerning for severe constipation. Electronically Signed   By: Lovey Newcomer M.D.   On: 03/20/2016 22:21       I have personally reviewed and evaluated these images and lab results as part of my medical decision-making.    MDM   I personally performed the services described in this documentation, which was scribed in my presence. The recorded information has been reviewed and considered. Lajean Saver, MD  Iv ns. toradol iv. Dilaudid iv. zofran iv.  Pain improved. Nausea persists.  Mild pain returns.  Dilaudid iv. zofran iv.   flomax po.  No recurrent emesis. Pain controlled.  Pt currently appears stable for d/c.  Discussed ct and f/u plan.      Lajean Saver, MD 03/20/16 [phone]

## 2016-03-20 NOTE — ED Notes (Signed)
Patient transported to CT 

## 2016-03-20 NOTE — ED Notes (Signed)
Pt c/o lower left abdominal, pelvic pain gradually getting worse since this morning.  Saw OB-GYN this evening and was told that she might have a kidney stone.  She denies any flank pain and denies dysuria.

## 2016-03-20 NOTE — ED Notes (Signed)
Pt verbalizes understanding of d/c instructions and denies any further needs at this time. 

## 2016-03-21 ENCOUNTER — Other Ambulatory Visit: Payer: Self-pay | Admitting: Urology

## 2016-03-21 MED ORDER — ONDANSETRON 8 MG PO TBDP: 8.0000 mg | ORAL_TABLET | Freq: Three times a day (TID) | ORAL | Status: DC | PRN

## 2016-03-21 MED ORDER — PROMETHAZINE HCL 12.5 MG PO TABS: 12.5000 mg | ORAL_TABLET | Freq: Four times a day (QID) | ORAL | Status: DC | PRN

## 2017-11-02 ENCOUNTER — Ambulatory Visit: Payer: Self-pay | Admitting: Orthopedic Surgery

## 2017-11-06 ENCOUNTER — Ambulatory Visit: Payer: Self-pay | Admitting: Orthopedic Surgery

## 2017-11-06 NOTE — H&P (Signed)
Rachel Fleming DOB: 23-Mar-1956 Married / Language: English / Race: White Female Date of Admission:  12/03/2017 CC:  Right hip pain History of Present Illness The patient is a 61 year old female who comes in for a preoperative History and Physical. The patient is scheduled for a right total hip arthroplasty (anterior) to be performed by Dr. Dione Plover. Aluisio, MD at [facility] on 12-03-2017. The patient is a 61 year old female who has been seen for bilateral hip pain. The patient is being followed for their more problematic right hip pain and osteoarthritis. They are several month(s) out from IA injection with Dr. Nelva Bush. Symptoms reported include: pain, aching and stiffness. The patient feels that they are doing poorly and report their pain level to be moderate. The patient has not gotten any relief of their symptoms with the most recent Cortisone injection. Her first injection really helped for about 3 months but the last one in June didn't help at all. Both hips are very problematic for her but the RIGHT has been worse long-term and hurts more than the LEFT one does. She has mechanical symptoms on the LEFT when she bends down and then gets to stand up she would get popping in that hip. The RIGHT one has more limited motion and worse pain. It is hurting her at all times now and limiting what she can and cannot do. The first cortisone injection was beneficial but the second did not help at all. She is now to stage her she feels like she needs more definitive treatment for the RIGHT and potentially LEFT hip. She is ready to proceed with surgery at this time. They have been treated conservatively in the past for the above stated problem and despite conservative measures, they continue to have progressive pain and severe functional limitations and dysfunction. They have failed non-operative management including home exercise, medications, and injections. It is felt that they would benefit from  undergoing total joint replacement. Risks and benefits of the procedure have been discussed with the patient and they elect to proceed with surgery. There are no active contraindications to surgery such as ongoing infection or rapidly progressive neurological disease.   Problem List/Past Medical Primary osteoarthritis of both knees (M17.0)  Pain of both hip joints (M25.551, M25.552)  Primary osteoarthritis of left hip (M16.12)  Kidney Stone  Osteoarthritis  Heart murmur  Hemorrhoids  Menopause  Measles  Mumps   Allergies Betadine *Antiseptics & Disinfectants  Family History Cancer  Maternal Grandfather, Maternal Grandmother. Heart Disease  Paternal Grandfather. Hypertension  Mother. Osteoarthritis  Mother, Paternal Grandmother.  Social History Children  0 Current work status  working full time Exercise  Exercises rarely Former drinker  12/02/2016: In the past drank beer and wine only occasionally per week Living situation  live with spouse Marital status  married No history of drug/alcohol rehab  Not under pain contract  Number of flights of stairs before winded  2-3 Tobacco / smoke exposure  12/02/2016: no Tobacco use  Former smoker. 12/02/2016: smoke(d) 1 pack(s) per day  Medication History Instaflex Active. One-A-Day Essential (Oral) Active. Aleve (220MG  Tablet, Oral) Active.  Past Surgical History Hysterectomy  partial (non-cancerous)   Review of Systems  General Not Present- Chills, Fatigue, Fever, Memory Loss, Night Sweats, Weight Gain and Weight Loss. Skin Not Present- Eczema, Hives, Itching, Lesions and Rash. HEENT Not Present- Dentures, Double Vision, Headache, Hearing Loss, Tinnitus and Visual Loss. Respiratory Not Present- Allergies, Chronic Cough, Coughing up blood, Shortness  of breath at rest and Shortness of breath with exertion. Cardiovascular Not Present- Chest Pain, Difficulty Breathing Lying Down, Murmur, Palpitations,  Racing/skipping heartbeats and Swelling. Gastrointestinal Not Present- Abdominal Pain, Bloody Stool, Constipation, Diarrhea, Difficulty Swallowing, Heartburn, Jaundice, Loss of appetitie, Nausea and Vomiting. Female Genitourinary Present- Urinating at Night. Not Present- Blood in Urine, Discharge, Flank Pain, Incontinence, Painful Urination, Urgency, Urinary frequency, Urinary Retention and Weak urinary stream. Musculoskeletal Present- Joint Pain. Not Present- Back Pain, Joint Swelling, Morning Stiffness, Muscle Pain, Muscle Weakness and Spasms. Neurological Not Present- Blackout spells, Difficulty with balance, Dizziness, Paralysis, Tremor and Weakness. Psychiatric Not Present- Insomnia.  Vitals Weight: 123 lb Height: 65in Weight was reported by patient. Height was reported by patient. Body Surface Area: 1.61 m Body Mass Index: 20.47 kg/m  Pulse: 68 (Regular)  BP: 128/74 (Sitting, Left Arm, Standard)   Physical Exam General Mental Status -Alert, cooperative and good historian. General Appearance-pleasant, Not in acute distress. Orientation-Oriented X3. Build & Nutrition-Well nourished and Well developed.  Head and Neck Head-normocephalic, atraumatic . Neck Global Assessment - supple, no bruit auscultated on the right, no bruit auscultated on the left.  Eye Pupil - Bilateral-Regular and Round. Motion - Bilateral-EOMI.  Chest and Lung Exam Auscultation Breath sounds - clear at anterior chest wall and clear at posterior chest wall. Adventitious sounds - No Adventitious sounds.  Cardiovascular Auscultation Rhythm - Regular rate and rhythm. Heart Sounds - S1 WNL and S2 WNL. Murmurs & Other Heart Sounds - Auscultation of the heart reveals - No Murmurs.  Abdomen Palpation/Percussion Tenderness - Abdomen is non-tender to palpation. Rigidity (guarding) - Abdomen is soft. Auscultation Auscultation of the abdomen reveals - Bowel sounds normal.  Female  Genitourinary Note: Not done, not pertinent to present illness   Musculoskeletal Note: Her RIGHT hip can be flexed to 100, rotated in 10, out 20, and abducted 20 with discomfort. Her LEFT hip can be flexed to 120, rotated in 30, out 30, and abducted 40 without discomfort. She does have some pain on provocative testing of the LEFT hip. She has an antalgic gait pattern on the RIGHT. Pulses, sensation and motor are intact both lower extremities.  Radiographs-AP pelvis and lateral both hips from January of this year are reviewed and she has bone-on-bone arthritis with subchondral cystic formation and osteophyte formation in the RIGHT hip. She has significant joint space narrowing on the LEFT with a small subchondral cyst but is not bone-on-bone.   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 Dema Severin  IV TXA  Anesthesia Issues: nausea in the past  Patient was instructed on what medications to stop prior to surgery.  Signed electronically by Joelene Millin, III PA-C

## 2017-11-24 ENCOUNTER — Other Ambulatory Visit (HOSPITAL_COMMUNITY): Payer: Self-pay | Admitting: Emergency Medicine

## 2017-11-24 NOTE — Progress Notes (Signed)
EKG 10-02-17 ON CHART [place] NP 10-02-17 ON CHART

## 2017-11-24 NOTE — Patient Instructions (Addendum)
Rachel Fleming  11/24/2017   Your procedure is scheduled on: 12-03-17  Report to [facility] Main  Entrance    Report to admitting at 1:00PM   Call this number if you have problems the morning of surgery [phone]   Remember: ONLY 1 PERSON MAY GO WITH YOU TO SHORT STAY TO GET  READY MORNING OF YOUR SURGERY.  Do not eat food After Midnight. YOU MAY HAVE CLEAR LIQUIDS FROM MIDNIGHT UNTIL 9:30AM DAY OF SURGERY. NOTHING BY MOUTH AFTER 9:30AM!     Take these medicines the morning of surgery with A SIP OF WATER: VICODIN IF NEEDED                                You may not have any metal on your body including hair pins and              piercings  Do not wear jewelry, make-up, lotions, powders or perfumes, deodorant             Do not wear nail polish.  Do not shave  48 hours prior to surgery.              Do not bring valuables to the hospital. [place].  Contacts, dentures or bridgework may not be worn into surgery.  Leave suitcase in the car. After surgery it may be brought to your room.                 Please read over the following fact sheets you were given: _____________________________________________________________________    CLEAR LIQUID DIET   Foods Allowed                                                                     Foods Excluded  Coffee and tea, regular and decaf                             liquids that you cannot  Plain Jell-O in any flavor                                             see through such as: Fruit ices (not with fruit pulp)                                     milk, soups, orange juice  Iced Popsicles                                    All solid food Carbonated beverages, regular and diet  Cranberry, grape and apple juices Sports drinks like Gatorade Lightly seasoned clear broth or consume(fat free) Sugar, honey syrup  Sample  Menu Breakfast                                Lunch                                     Supper Cranberry juice                    Beef broth                            Chicken broth Jell-O                                     Grape juice                           Apple juice Coffee or tea                        Jell-O                                      Popsicle                                                Coffee or tea                        Coffee or tea  _____________________________________________________________________  [facility] - Preparing for Surgery Before surgery, you can play an important role.  Because skin is not sterile, your skin needs to be as free of germs as possible.  You can reduce the number of germs on your skin by washing with CHG (chlorahexidine gluconate) soap before surgery.  CHG is an antiseptic cleaner which kills germs and bonds with the skin to continue killing germs even after washing. Please DO NOT use if you have an allergy to CHG or antibacterial soaps.  If your skin becomes reddened/irritated stop using the CHG and inform your nurse when you arrive at Short Stay. Do not shave (including legs and underarms) for at least 48 hours prior to the first CHG shower.  You may shave your face/neck. Please follow these instructions carefully:  1.  Shower with CHG Soap the night before surgery and the  morning of Surgery.  2.  If you choose to wash your hair, wash your hair first as usual with your  normal  shampoo.  3.  After you shampoo, rinse your hair and body thoroughly to remove the  shampoo.                           4.  Use CHG as you would any other liquid soap.  You can apply chg directly  to the skin and wash  Gently with a scrungie or clean washcloth.  5.  Apply the CHG Soap to your body ONLY FROM THE NECK DOWN.   Do not use on face/ open                           Wound or open sores. Avoid contact with eyes, ears mouth and genitals  (private parts).                       Wash face,  Genitals (private parts) with your normal soap.             6.  Wash thoroughly, paying special attention to the area where your surgery  will be performed.  7.  Thoroughly rinse your body with warm water from the neck down.  8.  DO NOT shower/wash with your normal soap after using and rinsing off  the CHG Soap.                9.  Pat yourself dry with a clean towel.            10.  Wear clean pajamas.            11.  Place clean sheets on your bed the night of your first shower and do not  sleep with pets. Day of Surgery : Do not apply any lotions/deodorants the morning of surgery.  Please wear clean clothes to the hospital/surgery center.  FAILURE TO FOLLOW THESE INSTRUCTIONS MAY RESULT IN THE CANCELLATION OF YOUR SURGERY PATIENT SIGNATURE_________________________________  NURSE SIGNATURE__________________________________  ________________________________________________________________________   Rachel Fleming  An incentive spirometer is a tool that can help keep your lungs clear and active. This tool measures how well you are filling your lungs with each breath. Taking long deep breaths may help reverse or decrease the chance of developing breathing (pulmonary) problems (especially infection) following:  A long period of time when you are unable to move or be active. BEFORE THE PROCEDURE   If the spirometer includes an indicator to show your best effort, your nurse or respiratory therapist will set it to a desired goal.  If possible, sit up straight or lean slightly forward. Try not to slouch.  Hold the incentive spirometer in an upright position. INSTRUCTIONS FOR USE  1. Sit on the edge of your bed if possible, or sit up as far as you can in bed or on a chair. 2. Hold the incentive spirometer in an upright position. 3. Breathe out normally. 4. Place the mouthpiece in your mouth and seal your lips tightly around  it. 5. Breathe in slowly and as deeply as possible, raising the piston or the ball toward the top of the column. 6. Hold your breath for 3-5 seconds or for as long as possible. Allow the piston or ball to fall to the bottom of the column. 7. Remove the mouthpiece from your mouth and breathe out normally. 8. Rest for a few seconds and repeat Steps 1 through 7 at least 10 times every 1-2 hours when you are awake. Take your time and take a few normal breaths between deep breaths. 9. The spirometer may include an indicator to show your best effort. Use the indicator as a goal to work toward during each repetition. 10. After each set of 10 deep breaths, practice coughing to be sure your lungs are clear. If you have an incision (the cut made at the time of  surgery), support your incision when coughing by placing a pillow or rolled up towels firmly against it. Once you are able to get out of bed, walk around indoors and cough well. You may stop using the incentive spirometer when instructed by your caregiver.  RISKS AND COMPLICATIONS  Take your time so you do not get dizzy or light-headed.  If you are in pain, you may need to take or ask for pain medication before doing incentive spirometry. It is harder to take a deep breath if you are having pain. AFTER USE  Rest and breathe slowly and easily.  It can be helpful to keep track of a log of your progress. Your caregiver can provide you with a simple table to help with this. If you are using the spirometer at home, follow these instructions: [place] IF:   You are having difficultly using the spirometer.  You have trouble using the spirometer as often as instructed.  Your pain medication is not giving enough relief while using the spirometer.  You develop fever of 100.5 F (38.1 C) or higher. SEEK IMMEDIATE MEDICAL CARE IF:   You cough up bloody sputum that had not been present before.  You develop fever of 102 F (38.9 C) or  greater.  You develop worsening pain at or near the incision site. MAKE SURE YOU:   Understand these instructions.  Will watch your condition.  Will get help right away if you are not doing well or get worse. Document Released: 03/24/2007 Document Revised: 02/03/2012 Document Reviewed: 05/25/2007 [employer] Patient Information 2014 [employer], [place].   ________________________________________________________________________  WHAT IS A BLOOD TRANSFUSION? Blood Transfusion Information  A transfusion is the replacement of blood or some of its parts. Blood is made up of multiple cells which provide different functions.  Red blood cells carry oxygen and are used for blood loss replacement.  White blood cells fight against infection.  Platelets control bleeding.  Plasma helps clot blood.  Other blood products are available for specialized needs, such as hemophilia or other clotting disorders. BEFORE THE TRANSFUSION  Who gives blood for transfusions?   Healthy volunteers who are fully evaluated to make sure their blood is safe. This is blood bank blood. Transfusion therapy is the safest it has ever been in the practice of medicine. Before blood is taken from a donor, a complete history is taken to make sure that person has no history of diseases nor engages in risky social behavior (examples are intravenous drug use or sexual activity with multiple partners). The donor's travel history is screened to minimize risk of transmitting infections, such as malaria. The donated blood is tested for signs of infectious diseases, such as HIV and hepatitis. The blood is then tested to be sure it is compatible with you in order to minimize the chance of a transfusion reaction. If you or a relative donates blood, this is often done in anticipation of surgery and is not appropriate for emergency situations. It takes many days to process the donated blood. RISKS AND COMPLICATIONS Although transfusion therapy  is very safe and saves many lives, the main dangers of transfusion include:   Getting an infectious disease.  Developing a transfusion reaction. This is an allergic reaction to something in the blood you were given. Every precaution is taken to prevent this. The decision to have a blood transfusion has been considered carefully by your caregiver before blood is given. Blood is not given unless the benefits outweigh the risks. AFTER THE  TRANSFUSION  Right after receiving a blood transfusion, you will usually feel much better and more energetic. This is especially true if your red blood cells have gotten low (anemic). The transfusion raises the level of the red blood cells which carry oxygen, and this usually causes an energy increase.  The nurse administering the transfusion will monitor you carefully for complications. HOME CARE INSTRUCTIONS  No special instructions are needed after a transfusion. You may find your energy is better. Speak with your caregiver about any limitations on activity for underlying diseases you may have. SEEK MEDICAL CARE IF:   Your condition is not improving after your transfusion.  You develop redness or irritation at the intravenous (IV) site. SEEK IMMEDIATE MEDICAL CARE IF:  Any of the following symptoms occur over the next 12 hours:  Shaking chills.  You have a temperature by mouth above 102 F (38.9 C), not controlled by medicine.  Chest, back, or muscle pain.  People around you feel you are not acting correctly or are confused.  Shortness of breath or difficulty breathing.  Dizziness and fainting.  You get a rash or develop hives.  You have a decrease in urine output.  Your urine turns a dark color or changes to pink, red, or brown. Any of the following symptoms occur over the next 10 days:  You have a temperature by mouth above 102 F (38.9 C), not controlled by medicine.  Shortness of breath.  Weakness after normal activity.  The white  part of the eye turns yellow (jaundice).  You have a decrease in the amount of urine or are urinating less often.  Your urine turns a dark color or changes to pink, red, or brown. Document Released: 11/08/2000 Document Revised: 02/03/2012 Document Reviewed: 06/27/2008 [facility] Patient Information 2014 [place], [place].  _______________________________________________________________________

## 2017-11-25 DIAGNOSIS — I351 Nonrheumatic aortic (valve) insufficiency: Secondary | ICD-10-CM

## 2017-11-25 HISTORY — DX: Nonrheumatic aortic (valve) insufficiency: I35.1

## 2017-11-27 ENCOUNTER — Encounter (HOSPITAL_COMMUNITY): Payer: Self-pay

## 2017-11-27 ENCOUNTER — Encounter (INDEPENDENT_AMBULATORY_CARE_PROVIDER_SITE_OTHER): Payer: Self-pay

## 2017-11-27 ENCOUNTER — Other Ambulatory Visit: Payer: Self-pay

## 2017-11-27 ENCOUNTER — Encounter (HOSPITAL_COMMUNITY)
Admission: RE | Admit: 2017-11-27 | Discharge: 2017-11-27 | Disposition: A | Discharge status: Home / Self Care | Payer: Commercial Managed Care - PPO | Source: Ambulatory Visit | Attending: Orthopedic Surgery | Admitting: Orthopedic Surgery

## 2017-11-27 DIAGNOSIS — Z01812 Encounter for preprocedural laboratory examination: Secondary | ICD-10-CM | POA: Diagnosis present

## 2017-11-27 HISTORY — DX: Nausea with vomiting, unspecified: R11.2

## 2017-11-27 HISTORY — DX: Nonrheumatic aortic (valve) insufficiency: I35.1

## 2017-11-27 HISTORY — DX: Cardiac murmur, unspecified: R01.1

## 2017-11-27 HISTORY — DX: Personal history of urinary calculi: Z87.442

## 2017-11-27 HISTORY — DX: Unspecified osteoarthritis, unspecified site: M19.90

## 2017-11-27 HISTORY — DX: Other specified postprocedural states: Z98.890

## 2017-11-27 LAB — COMPREHENSIVE METABOLIC PANEL
ALK PHOS: 93 U/L (ref 38–126)
ALT: 22 U/L (ref 14–54)
ANION GAP: 7 (ref 5–15)
AST: 27 U/L (ref 15–41)
Albumin: 4 g/dL (ref 3.5–5.0)
BUN: 20 mg/dL (ref 6–20)
CHLORIDE: 105 mmol/L (ref 101–111)
CO2: 27 mmol/L (ref 22–32)
Calcium: 9.6 mg/dL (ref 8.9–10.3)
Creatinine, Ser: 0.45 mg/dL (ref 0.44–1.00)
GFR calc non Af Amer: >60 mL/min (ref >60)
Glucose, Bld: 90 mg/dL (ref 65–99)
POTASSIUM: 3.9 mmol/L (ref 3.5–5.1)
SODIUM: 139 mmol/L (ref 135–145)
TOTAL PROTEIN: 7.1 g/dL (ref 6.5–8.1)
Total Bilirubin: 0.9 mg/dL (ref 0.3–1.2)

## 2017-11-27 LAB — CBC
HCT: 39.2 % (ref 36.0–46.0)
Hemoglobin: 13.1 g/dL (ref 12.0–15.0)
MCH: 31.6 pg (ref 26.0–34.0)
MCHC: 33.4 g/dL (ref 30.0–36.0)
MCV: 94.7 fL (ref 78.0–100.0)
PLATELETS: 345 10*3/uL (ref 150–400)
RBC: 4.14 MIL/uL (ref 3.87–5.11)
RDW: 13.7 % (ref 11.5–15.5)
WBC: 7.5 10*3/uL (ref 4.0–10.5)

## 2017-11-27 LAB — SURGICAL PCR SCREEN
MRSA, PCR: NEGATIVE
STAPHYLOCOCCUS AUREUS: NEGATIVE

## 2017-11-27 LAB — PROTIME-INR
INR: 0.97
PROTHROMBIN TIME: 12.8 s (ref 11.4–15.2)

## 2017-11-27 LAB — APTT: aPTT: 28 seconds (ref 24–36)

## 2017-11-27 NOTE — Progress Notes (Signed)
RN went to see anesthesia for consult . RN spoke with Dr Roberts Gaudy; had him review patient last 2 ECHOs (see in epic)  and most recent EKG. RN reported to Dr Linna Caprice , that last ECHO had doctor notation that patient should repeat in 2 years and patient did not do this but denies cardiac symptoms. Per Dr Harmon Pier, as patient is asymptomatic and ECHOs okay ; patient does not need to have ECHO repeated before proceeding and may proceed with surgery as scheduled.

## 2017-11-28 LAB — ABO/RH: ABO/RH(D): O POS

## 2017-12-02 NOTE — H&P (Signed)
Rachel Fleming DOB: Jan 11, 1956 Married / Language: English / Race: White Female Date of Admission:  12/03/2017 CC:  Right hip pain History of Present Illness The patient is a 62 year old female who comes in for a preoperative History and Physical. The patient is scheduled for a right total hip arthroplasty (anterior) to be performed by Dr. Dione Plover. Aluisio, MD at [facility] on 12-03-2017. The patient is a 62 year old female who has been seen for bilateral hip pain. The patient is being followed for their more problematic right hip pain and osteoarthritis. They are several month(s) out from IA injection with Dr. Nelva Bush. Symptoms reported include: pain, aching and stiffness. The patient feels that they are doing poorly and report their pain level to be moderate. The patient has not gotten any relief of their symptoms with the most recent Cortisone injection. Her first injection really helped for about 3 months but the last one in June didn't help at all. Both hips are very problematic for her but the RIGHT has been worse long-term and hurts more than the LEFT one does. She has mechanical symptoms on the LEFT when she bends down and then gets to stand up she would get popping in that hip. The RIGHT one has more limited motion and worse pain. It is hurting her at all times now and limiting what she can and cannot do. The first cortisone injection was beneficial but the second did not help at all. She is now to stage her she feels like she needs more definitive treatment for the RIGHT and potentially LEFT hip. She is ready to proceed with surgery at this time. They have been treated conservatively in the past for the above stated problem and despite conservative measures, they continue to have progressive pain and severe functional limitations and dysfunction. They have failed non-operative management including home exercise, medications, and injections. It is felt that they would benefit from  undergoing total joint replacement. Risks and benefits of the procedure have been discussed with the patient and they elect to proceed with surgery. There are no active contraindications to surgery such as ongoing infection or rapidly progressive neurological disease.   Problem List/Past Medical Primary osteoarthritis of both knees (M17.0)  Pain of both hip joints (M25.551, M25.552)  Primary osteoarthritis of left hip (M16.12)  Kidney Stone  Osteoarthritis  Heart murmur  Hemorrhoids  Menopause  Measles  Mumps   Allergies Betadine *Antiseptics & Disinfectants  Family History Cancer  Maternal Grandfather, Maternal Grandmother. Heart Disease  Paternal Grandfather. Hypertension  Mother. Osteoarthritis  Mother, Paternal Grandmother.  Social History Children  0 Current work status  working full time Exercise  Exercises rarely Former drinker  12/02/2016: In the past drank beer and wine only occasionally per week Living situation  live with spouse Marital status  married No history of drug/alcohol rehab  Not under pain contract  Number of flights of stairs before winded  2-3 Tobacco / smoke exposure  12/02/2016: no Tobacco use  Former smoker. 12/02/2016: smoke(d) 1 pack(s) per day  Medication History Instaflex Active. One-A-Day Essential (Oral) Active. Aleve (220MG  Tablet, Oral) Active.  Past Surgical History Hysterectomy  partial (non-cancerous)   Review of Systems  General Not Present- Chills, Fatigue, Fever, Memory Loss, Night Sweats, Weight Gain and Weight Loss. Skin Not Present- Eczema, Hives, Itching, Lesions and Rash. HEENT Not Present- Dentures, Double Vision, Headache, Hearing Loss, Tinnitus and Visual Loss. Respiratory Not Present- Allergies, Chronic Cough, Coughing up blood, Shortness  of breath at rest and Shortness of breath with exertion. Cardiovascular Not Present- Chest Pain, Difficulty Breathing Lying Down, Murmur,  Palpitations, Racing/skipping heartbeats and Swelling. Gastrointestinal Not Present- Abdominal Pain, Bloody Stool, Constipation, Diarrhea, Difficulty Swallowing, Heartburn, Jaundice, Loss of appetitie, Nausea and Vomiting. Female Genitourinary Present- Urinating at Night. Not Present- Blood in Urine, Discharge, Flank Pain, Incontinence, Painful Urination, Urgency, Urinary frequency, Urinary Retention and Weak urinary stream. Musculoskeletal Present- Joint Pain. Not Present- Back Pain, Joint Swelling, Morning Stiffness, Muscle Pain, Muscle Weakness and Spasms. Neurological Not Present- Blackout spells, Difficulty with balance, Dizziness, Paralysis, Tremor and Weakness. Psychiatric Not Present- Insomnia.  Vitals Weight: 123 lb Height: 65in Weight was reported by patient. Height was reported by patient. Body Surface Area: 1.61 m Body Mass Index: 20.47 kg/m  Pulse: 68 (Regular)  BP: 128/74 (Sitting, Left Arm, Standard)   Physical Exam General Mental Status -Alert, cooperative and good historian. General Appearance-pleasant, Not in acute distress. Orientation-Oriented X3. Build & Nutrition-Well nourished and Well developed.  Head and Neck Head-normocephalic, atraumatic . Neck Global Assessment - supple, no bruit auscultated on the right, no bruit auscultated on the left.  Eye Pupil - Bilateral-Regular and Round. Motion - Bilateral-EOMI.  Chest and Lung Exam Auscultation Breath sounds - clear at anterior chest wall and clear at posterior chest wall. Adventitious sounds - No Adventitious sounds.  Cardiovascular Auscultation Rhythm - Regular rate and rhythm. Heart Sounds - S1 WNL and S2 WNL. Murmurs & Other Heart Sounds - Auscultation of the heart reveals - No Murmurs.  Abdomen Palpation/Percussion Tenderness - Abdomen is non-tender to palpation. Rigidity (guarding) - Abdomen is soft. Auscultation Auscultation of the abdomen reveals - Bowel sounds  normal.  Female Genitourinary Note: Not done, not pertinent to present illness   Musculoskeletal Note: Her RIGHT hip can be flexed to 100, rotated in 10, out 20, and abducted 20 with discomfort. Her LEFT hip can be flexed to 120, rotated in 30, out 30, and abducted 40 without discomfort. She does have some pain on provocative testing of the LEFT hip. She has an antalgic gait pattern on the RIGHT. Pulses, sensation and motor are intact both lower extremities.  Radiographs-AP pelvis and lateral both hips from January of this year are reviewed and she has bone-on-bone arthritis with subchondral cystic formation and osteophyte formation in the RIGHT hip. She has significant joint space narrowing on the LEFT with a small subchondral cyst but is not bone-on-bone.   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 Dema Severin  IV TXA  Anesthesia Issues: nausea in the past  Patient was instructed on what medications to stop prior to surgery.  Signed electronically by Joelene Millin, III PA-C

## 2017-12-02 NOTE — Progress Notes (Signed)
Patient aware of surgery time change. Instructed to arrive at admitting to check in at 0930. NPO after midnight ; sips of water with medicine ok. patient verbalized understanding

## 2017-12-03 ENCOUNTER — Inpatient Hospital Stay (HOSPITAL_COMMUNITY)
Admission: RE | Admit: 2017-12-03 | Discharge: 2017-12-04 | DRG: 470 | Disposition: A | Discharge status: Home Health | Payer: Commercial Managed Care - PPO | Source: Ambulatory Visit | Attending: Orthopedic Surgery | Admitting: Orthopedic Surgery

## 2017-12-03 ENCOUNTER — Inpatient Hospital Stay (HOSPITAL_COMMUNITY): Payer: Commercial Managed Care - PPO

## 2017-12-03 ENCOUNTER — Inpatient Hospital Stay (HOSPITAL_COMMUNITY): Payer: Commercial Managed Care - PPO | Admitting: Anesthesiology

## 2017-12-03 ENCOUNTER — Encounter (HOSPITAL_COMMUNITY)
Admission: RE | Disposition: A | Discharge status: Home Health | Payer: Self-pay | Source: Ambulatory Visit | Attending: Orthopedic Surgery

## 2017-12-03 ENCOUNTER — Encounter (HOSPITAL_COMMUNITY): Payer: Self-pay | Admitting: *Deleted

## 2017-12-03 ENCOUNTER — Other Ambulatory Visit: Payer: Self-pay

## 2017-12-03 DIAGNOSIS — Z87891 Personal history of nicotine dependence: Secondary | ICD-10-CM

## 2017-12-03 DIAGNOSIS — M169 Osteoarthritis of hip, unspecified: Secondary | ICD-10-CM | POA: Diagnosis present

## 2017-12-03 DIAGNOSIS — M170 Bilateral primary osteoarthritis of knee: Secondary | ICD-10-CM | POA: Diagnosis present

## 2017-12-03 DIAGNOSIS — M1611 Unilateral primary osteoarthritis, right hip: Principal | ICD-10-CM | POA: Diagnosis present

## 2017-12-03 DIAGNOSIS — M25551 Pain in right hip: Secondary | ICD-10-CM | POA: Diagnosis present

## 2017-12-03 DIAGNOSIS — Z96649 Presence of unspecified artificial hip joint: Secondary | ICD-10-CM

## 2017-12-03 HISTORY — PX: TOTAL HIP ARTHROPLASTY: SHX124

## 2017-12-03 LAB — TYPE AND SCREEN
ABO/RH(D): O POS
ANTIBODY SCREEN: NEGATIVE

## 2017-12-03 SURGERY — ARTHROPLASTY, HIP, TOTAL, ANTERIOR APPROACH
Anesthesia: Spinal | Site: Hip | Laterality: Right

## 2017-12-03 MED ORDER — PROMETHAZINE HCL 25 MG/ML IJ SOLN: 6.2500 mg | INTRAMUSCULAR | Status: DC | PRN

## 2017-12-03 MED ORDER — ACETAMINOPHEN 650 MG RE SUPP: 650.0000 mg | RECTAL | Status: DC | PRN

## 2017-12-03 MED ORDER — ACETAMINOPHEN 10 MG/ML IV SOLN
1000.0000 mg | Freq: Once | INTRAVENOUS | Status: AC
  Administered 2017-12-03: 1000 mg via INTRAVENOUS
  Filled 2017-12-03: qty 100

## 2017-12-03 MED ORDER — BUPIVACAINE HCL (PF) 0.25 % IJ SOLN
INTRAMUSCULAR | Status: DC | PRN
  Administered 2017-12-03: 30 mL

## 2017-12-03 MED ORDER — POLYETHYLENE GLYCOL 3350 17 G PO PACK: 17.0000 g | PACK | Freq: Every day | ORAL | Status: DC | PRN

## 2017-12-03 MED ORDER — ONDANSETRON HCL 4 MG PO TABS
4.0000 mg | ORAL_TABLET | Freq: Four times a day (QID) | ORAL | Status: DC | PRN
  Filled 2017-12-03: qty 1

## 2017-12-03 MED ORDER — HYDROMORPHONE HCL 1 MG/ML IJ SOLN
INTRAMUSCULAR | Status: AC
  Administered 2017-12-03: 0.5 mg via INTRAVENOUS
  Filled 2017-12-03: qty 1

## 2017-12-03 MED ORDER — TRAMADOL HCL 50 MG PO TABS: 50.0000 mg | ORAL_TABLET | Freq: Four times a day (QID) | ORAL | Status: DC | PRN

## 2017-12-03 MED ORDER — PROMETHAZINE HCL 25 MG/ML IJ SOLN
25.0000 mg | Freq: Three times a day (TID) | INTRAMUSCULAR | Status: DC | PRN
  Administered 2017-12-03: 25 mg via INTRAVENOUS
  Filled 2017-12-03: qty 1

## 2017-12-03 MED ORDER — MIDAZOLAM HCL 5 MG/5ML IJ SOLN
INTRAMUSCULAR | Status: DC | PRN
  Administered 2017-12-03: 2 mg via INTRAVENOUS

## 2017-12-03 MED ORDER — EPHEDRINE 5 MG/ML INJ
INTRAVENOUS | Status: AC
  Filled 2017-12-03: qty 10

## 2017-12-03 MED ORDER — PHENYLEPHRINE HCL 10 MG/ML IJ SOLN
INTRAMUSCULAR | Status: DC | PRN
  Administered 2017-12-03: 30 ug/min via INTRAVENOUS

## 2017-12-03 MED ORDER — OXYCODONE HCL 5 MG PO TABS
5.0000 mg | ORAL_TABLET | ORAL | Status: DC | PRN
  Administered 2017-12-03 – 2017-12-04 (×2): 5 mg via ORAL
  Filled 2017-12-03 (×2): qty 1

## 2017-12-03 MED ORDER — CEFAZOLIN SODIUM-DEXTROSE 2-4 GM/100ML-% IV SOLN
2.0000 g | INTRAVENOUS | Status: AC
  Administered 2017-12-03: 2 g via INTRAVENOUS
  Filled 2017-12-03: qty 100

## 2017-12-03 MED ORDER — PROPOFOL 500 MG/50ML IV EMUL
INTRAVENOUS | Status: DC | PRN
  Administered 2017-12-03: 50 ug/kg/min via INTRAVENOUS

## 2017-12-03 MED ORDER — FLEET ENEMA 7-19 GM/118ML RE ENEM: 1.0000 | ENEMA | Freq: Once | RECTAL | Status: DC | PRN

## 2017-12-03 MED ORDER — CEFAZOLIN SODIUM-DEXTROSE 2-4 GM/100ML-% IV SOLN
INTRAVENOUS | Status: AC
  Filled 2017-12-03: qty 100

## 2017-12-03 MED ORDER — METOCLOPRAMIDE HCL 5 MG PO TABS: 5.0000 mg | ORAL_TABLET | Freq: Three times a day (TID) | ORAL | Status: DC | PRN

## 2017-12-03 MED ORDER — FENTANYL CITRATE (PF) 100 MCG/2ML IJ SOLN
INTRAMUSCULAR | Status: AC
  Filled 2017-12-03: qty 2

## 2017-12-03 MED ORDER — PHENYLEPHRINE 40 MCG/ML (10ML) SYRINGE FOR IV PUSH (FOR BLOOD PRESSURE SUPPORT)
PREFILLED_SYRINGE | INTRAVENOUS | Status: AC
  Filled 2017-12-03: qty 30

## 2017-12-03 MED ORDER — TRANEXAMIC ACID 1000 MG/10ML IV SOLN
1000.0000 mg | Freq: Once | INTRAVENOUS | Status: AC
  Administered 2017-12-03: 18:00:00 1000 mg via INTRAVENOUS
  Filled 2017-12-03: qty 1100

## 2017-12-03 MED ORDER — HYDROMORPHONE HCL 1 MG/ML IJ SOLN
0.2500 mg | INTRAMUSCULAR | Status: DC | PRN
  Administered 2017-12-03 (×2): 0.25 mg via INTRAVENOUS
  Administered 2017-12-03 (×3): 0.5 mg via INTRAVENOUS

## 2017-12-03 MED ORDER — METHOCARBAMOL 500 MG PO TABS
500.0000 mg | ORAL_TABLET | Freq: Four times a day (QID) | ORAL | Status: DC | PRN
  Administered 2017-12-04: 09:00:00 500 mg via ORAL
  Filled 2017-12-03: qty 1

## 2017-12-03 MED ORDER — MENTHOL 3 MG MT LOZG: 1.0000 | LOZENGE | OROMUCOSAL | Status: DC | PRN

## 2017-12-03 MED ORDER — BUPIVACAINE HCL (PF) 0.75 % IJ SOLN
INTRAMUSCULAR | Status: DC | PRN
  Administered 2017-12-03: 1.6 mL via INTRATHECAL

## 2017-12-03 MED ORDER — PROPOFOL 10 MG/ML IV BOLUS
INTRAVENOUS | Status: AC
  Filled 2017-12-03: qty 20

## 2017-12-03 MED ORDER — PHENOL 1.4 % MT LIQD: 1.0000 | OROMUCOSAL | Status: DC | PRN

## 2017-12-03 MED ORDER — DOCUSATE SODIUM 100 MG PO CAPS
100.0000 mg | ORAL_CAPSULE | Freq: Two times a day (BID) | ORAL | Status: DC
  Administered 2017-12-04: 10:00:00 100 mg via ORAL
  Filled 2017-12-03: qty 1

## 2017-12-03 MED ORDER — LACTATED RINGERS IV SOLN
INTRAVENOUS | Status: DC
  Administered 2017-12-03 (×3): via INTRAVENOUS

## 2017-12-03 MED ORDER — DEXAMETHASONE SODIUM PHOSPHATE 10 MG/ML IJ SOLN
10.0000 mg | Freq: Once | INTRAMUSCULAR | Status: AC
  Administered 2017-12-04: 10 mg via INTRAVENOUS
  Filled 2017-12-03: qty 1

## 2017-12-03 MED ORDER — RIVAROXABAN 10 MG PO TABS
10.0000 mg | ORAL_TABLET | Freq: Every day | ORAL | Status: DC
  Administered 2017-12-04: 10 mg via ORAL
  Filled 2017-12-03: qty 1

## 2017-12-03 MED ORDER — SODIUM CHLORIDE 0.9 % IV SOLN
1000.0000 mg | INTRAVENOUS | Status: AC
  Administered 2017-12-03: 1000 mg via INTRAVENOUS
  Filled 2017-12-03: qty 1100

## 2017-12-03 MED ORDER — BISACODYL 10 MG RE SUPP: 10.0000 mg | Freq: Every day | RECTAL | Status: DC | PRN

## 2017-12-03 MED ORDER — CHLORHEXIDINE GLUCONATE 4 % EX LIQD: 60.0000 mL | Freq: Once | CUTANEOUS | Status: DC

## 2017-12-03 MED ORDER — ACETAMINOPHEN 10 MG/ML IV SOLN: 1000.0000 mg | Freq: Once | INTRAVENOUS | Status: DC | PRN

## 2017-12-03 MED ORDER — MEPERIDINE HCL 50 MG/ML IJ SOLN: 6.2500 mg | INTRAMUSCULAR | Status: DC | PRN

## 2017-12-03 MED ORDER — MIDAZOLAM HCL 2 MG/2ML IJ SOLN
INTRAMUSCULAR | Status: AC
  Filled 2017-12-03: qty 2

## 2017-12-03 MED ORDER — STERILE WATER FOR IRRIGATION IR SOLN
Status: DC | PRN
  Administered 2017-12-03: 2000 mL

## 2017-12-03 MED ORDER — MORPHINE SULFATE (PF) 2 MG/ML IV SOLN
1.0000 mg | INTRAVENOUS | Status: DC | PRN
  Administered 2017-12-03 (×2): 1 mg via INTRAVENOUS
  Filled 2017-12-03 (×3): qty 1

## 2017-12-03 MED ORDER — FENTANYL CITRATE (PF) 100 MCG/2ML IJ SOLN
INTRAMUSCULAR | Status: DC | PRN
  Administered 2017-12-03: 100 ug via INTRAVENOUS

## 2017-12-03 MED ORDER — SODIUM CHLORIDE 0.9 % IV SOLN
INTRAVENOUS | Status: DC
  Administered 2017-12-03: 1000 mL via INTRAVENOUS

## 2017-12-03 MED ORDER — PROPOFOL 500 MG/50ML IV EMUL
INTRAVENOUS | Status: DC | PRN
  Administered 2017-12-03: 30 mg via INTRAVENOUS

## 2017-12-03 MED ORDER — EPHEDRINE SULFATE 50 MG/ML IJ SOLN
INTRAMUSCULAR | Status: DC | PRN
  Administered 2017-12-03: 10 mg via INTRAVENOUS

## 2017-12-03 MED ORDER — CEFAZOLIN SODIUM-DEXTROSE 2-4 GM/100ML-% IV SOLN
2.0000 g | Freq: Four times a day (QID) | INTRAVENOUS | Status: AC
  Administered 2017-12-03 – 2017-12-04 (×2): 2 g via INTRAVENOUS
  Filled 2017-12-03 (×2): qty 100

## 2017-12-03 MED ORDER — DIPHENHYDRAMINE HCL 12.5 MG/5ML PO ELIX: 12.5000 mg | ORAL_SOLUTION | ORAL | Status: DC | PRN

## 2017-12-03 MED ORDER — OXYCODONE HCL 5 MG PO TABS
10.0000 mg | ORAL_TABLET | ORAL | Status: DC | PRN
  Administered 2017-12-04: 5 mg via ORAL
  Filled 2017-12-03 (×2): qty 2

## 2017-12-03 MED ORDER — PHENYLEPHRINE HCL 10 MG/ML IJ SOLN
INTRAMUSCULAR | Status: DC | PRN
  Administered 2017-12-03: 120 ug via INTRAVENOUS
  Administered 2017-12-03 (×4): 80 ug via INTRAVENOUS

## 2017-12-03 MED ORDER — HYDROCODONE-ACETAMINOPHEN 7.5-325 MG PO TABS: 1.0000 | ORAL_TABLET | Freq: Once | ORAL | Status: DC | PRN

## 2017-12-03 MED ORDER — ACETAMINOPHEN 325 MG PO TABS: 650.0000 mg | ORAL_TABLET | ORAL | Status: DC | PRN

## 2017-12-03 MED ORDER — DEXAMETHASONE SODIUM PHOSPHATE 10 MG/ML IJ SOLN
10.0000 mg | Freq: Once | INTRAMUSCULAR | Status: AC
  Administered 2017-12-03: 10 mg via INTRAVENOUS

## 2017-12-03 MED ORDER — ACETAMINOPHEN 500 MG PO TABS
1000.0000 mg | ORAL_TABLET | Freq: Four times a day (QID) | ORAL | Status: DC
  Administered 2017-12-04 (×3): 1000 mg via ORAL
  Filled 2017-12-03 (×3): qty 2

## 2017-12-03 MED ORDER — ONDANSETRON HCL 4 MG/2ML IJ SOLN
4.0000 mg | Freq: Four times a day (QID) | INTRAMUSCULAR | Status: DC | PRN
  Administered 2017-12-03: 4 mg via INTRAVENOUS
  Filled 2017-12-03: qty 2

## 2017-12-03 MED ORDER — METOCLOPRAMIDE HCL 5 MG/ML IJ SOLN
5.0000 mg | Freq: Three times a day (TID) | INTRAMUSCULAR | Status: DC | PRN
  Administered 2017-12-03: 17:00:00 10 mg via INTRAVENOUS
  Filled 2017-12-03: qty 2

## 2017-12-03 MED ORDER — 0.9 % SODIUM CHLORIDE (POUR BTL) OPTIME
TOPICAL | Status: DC | PRN
  Administered 2017-12-03: 1000 mL

## 2017-12-03 MED ORDER — METHOCARBAMOL 1000 MG/10ML IJ SOLN
500.0000 mg | Freq: Four times a day (QID) | INTRAVENOUS | Status: DC | PRN
  Administered 2017-12-03: 500 mg via INTRAVENOUS
  Filled 2017-12-03: qty 550

## 2017-12-03 MED ORDER — BUPIVACAINE HCL (PF) 0.25 % IJ SOLN
INTRAMUSCULAR | Status: AC
  Filled 2017-12-03: qty 30

## 2017-12-03 SURGICAL SUPPLY — 48 items
BAG SPEC THK2 15X12 ZIP CLS (MISCELLANEOUS) ×1
BAG URINE DRAINAGE (UROLOGICAL SUPPLIES) ×2 IMPLANT
BAG ZIPLOCK 12X15 (MISCELLANEOUS) ×2 IMPLANT
BLADE SAG 18X100X1.27 (BLADE) ×3 IMPLANT
CAPT HIP TOTAL 2 ×2 IMPLANT
CLOSURE WOUND 1/2 X4 (GAUZE/BANDAGES/DRESSINGS) ×1
CLOTH BEACON ORANGE TIMEOUT ST (SAFETY) ×3 IMPLANT
COVER PERINEAL POST (MISCELLANEOUS) ×3 IMPLANT
COVER SURGICAL LIGHT HANDLE (MISCELLANEOUS) ×3 IMPLANT
DECANTER SPIKE VIAL GLASS SM (MISCELLANEOUS) ×3 IMPLANT
DRAPE INCISE 23X17 IOBAN STRL (DRAPES) ×2
DRAPE INCISE 23X17 STRL (DRAPES) IMPLANT
DRAPE INCISE IOBAN 23X17 STRL (DRAPES) ×1 IMPLANT
DRAPE POUCH INSTRU U-SHP 10X18 (DRAPES) ×2 IMPLANT
DRAPE U-SHAPE 47X51 STRL (DRAPES) ×6 IMPLANT
DRSG ADAPTIC 3X8 NADH LF (GAUZE/BANDAGES/DRESSINGS) ×3 IMPLANT
DRSG MEPILEX BORDER 4X4 (GAUZE/BANDAGES/DRESSINGS) ×3 IMPLANT
DRSG MEPILEX BORDER 4X8 (GAUZE/BANDAGES/DRESSINGS) ×3 IMPLANT
DURAPREP 26ML APPLICATOR (WOUND CARE) ×3 IMPLANT
ELECT REM PT RETURN 15FT ADLT (MISCELLANEOUS) ×3 IMPLANT
EVACUATOR 1/8 PVC DRAIN (DRAIN) ×3 IMPLANT
GLOVE BIO SURGEON STRL SZ 6.5 (GLOVE) ×1 IMPLANT
GLOVE BIO SURGEON STRL SZ7.5 (GLOVE) ×3 IMPLANT
GLOVE BIO SURGEON STRL SZ8 (GLOVE) ×6 IMPLANT
GLOVE BIO SURGEONS STRL SZ 6.5 (GLOVE) ×1
GLOVE BIOGEL PI IND STRL 6.5 (GLOVE) IMPLANT
GLOVE BIOGEL PI IND STRL 7.5 (GLOVE) IMPLANT
GLOVE BIOGEL PI IND STRL 8 (GLOVE) ×2 IMPLANT
GLOVE BIOGEL PI INDICATOR 6.5 (GLOVE) ×2
GLOVE BIOGEL PI INDICATOR 7.5 (GLOVE) ×8
GLOVE BIOGEL PI INDICATOR 8 (GLOVE) ×4
GLOVE SURG SS PI 7.0 STRL IVOR (GLOVE) ×2 IMPLANT
GLOVE SURG SS PI 7.5 STRL IVOR (GLOVE) ×2 IMPLANT
GOWN SPEC L3 XXLG W/TWL (GOWN DISPOSABLE) ×2 IMPLANT
GOWN STRL REUS W/TWL LRG LVL3 (GOWN DISPOSABLE) ×5 IMPLANT
GOWN STRL REUS W/TWL XL LVL3 (GOWN DISPOSABLE) ×5 IMPLANT
PACK ANTERIOR HIP CUSTOM (KITS) ×3 IMPLANT
SCRUB TECHNI CARE 4 OZ NO DYE (MISCELLANEOUS) ×2 IMPLANT
STRIP CLOSURE SKIN 1/2X4 (GAUZE/BANDAGES/DRESSINGS) ×2 IMPLANT
SUT ETHIBOND NAB CT1 #1 30IN (SUTURE) ×3 IMPLANT
SUT MNCRL AB 4-0 PS2 18 (SUTURE) ×3 IMPLANT
SUT STRATAFIX 0 PDS 27 VIOLET (SUTURE) ×3
SUT VIC AB 2-0 CT1 27 (SUTURE) ×6
SUT VIC AB 2-0 CT1 TAPERPNT 27 (SUTURE) ×2 IMPLANT
SUTURE STRATFX 0 PDS 27 VIOLET (SUTURE) ×1 IMPLANT
SYR 50ML LL SCALE MARK (SYRINGE) ×2 IMPLANT
TRAY FOLEY CATH SILVER 14FR (SET/KITS/TRAYS/PACK) ×2 IMPLANT
YANKAUER SUCT BULB TIP 10FT TU (MISCELLANEOUS) ×3 IMPLANT

## 2017-12-03 NOTE — Op Note (Signed)
OPERATIVE REPORT- TOTAL HIP ARTHROPLASTY   PREOPERATIVE DIAGNOSIS: Osteoarthritis of the Right hip.   POSTOPERATIVE DIAGNOSIS: Osteoarthritis of the Right  hip.   PROCEDURE: Right total hip arthroplasty, anterior approach.   SURGEON: Gaynelle Arabian, MD   ASSISTANT: Arlee Muslim, PA-C  ANESTHESIA:  Spinal  ESTIMATED BLOOD LOSS:-250 mL    DRAINS: Hemovac x1.   COMPLICATIONS: None   CONDITION: PACU - hemodynamically stable.   BRIEF CLINICAL NOTE: Rachel Fleming is a 62 y.o. female who has advanced end-  stage arthritis of their Right  hip with progressively worsening pain and  dysfunction.The patient has failed nonoperative management and presents for  total hip arthroplasty.   PROCEDURE IN DETAIL: After successful administration of spinal  anesthetic, the traction boots for the [facility] bed were placed on both  feet and the patient was placed onto the [facility] bed, boots placed into the leg  holders. The Right hip was then isolated from the perineum with plastic  drapes and prepped and draped in the usual sterile fashion. ASIS and  greater trochanter were marked and a oblique incision was made, starting  at about 1 cm lateral and 2 cm distal to the ASIS and coursing towards  the anterior cortex of the femur. The skin was cut with a 10 blade  through subcutaneous tissue to the level of the fascia overlying the  tensor fascia lata muscle. The fascia was then incised in line with the  incision at the junction of the anterior third and posterior 2/3rd. The  muscle was teased off the fascia and then the interval between the TFL  and the rectus was developed. The Hohmann retractor was then placed at  the top of the femoral neck over the capsule. The vessels overlying the  capsule were cauterized and the fat on top of the capsule was removed.  A Hohmann retractor was then placed anterior underneath the rectus  femoris to give exposure to the entire anterior capsule. A T-shaped   capsulotomy was performed. The edges were tagged and the femoral head  was identified.       Osteophytes are removed off the superior acetabulum.  The femoral neck was then cut in situ with an oscillating saw. Traction  was then applied to the left lower extremity utilizing the [facility]  traction. The femoral head was then removed. Retractors were placed  around the acetabulum and then circumferential removal of the labrum was  performed. Osteophytes were also removed. Reaming starts at 45 mm to  medialize and  Increased in 2 mm increments to 47 mm. We reamed in  approximately 40 degrees of abduction, 20 degrees anteversion. A 48 mm  pinnacle acetabular shell was then impacted in anatomic position under  fluoroscopic guidance with excellent purchase. We did not need to place  any additional dome screws. A 28 mm neutral + 4 marathon liner was then  placed into the acetabular shell.       The femoral lift was then placed along the lateral aspect of the femur  just distal to the vastus ridge. The leg was  externally rotated and capsule  was stripped off the inferior aspect of the femoral neck down to the  level of the lesser trochanter, this was done with electrocautery. The femur was lifted after this was performed. The  leg was then placed in an extended and adducted position essentially delivering the femur. We also removed the capsule superiorly and the piriformis from the piriformis  fossa to gain excellent exposure of the  proximal femur. Rongeur was used to remove some cancellous bone to get  into the lateral portion of the proximal femur for placement of the  initial starter reamer. The starter broaches was placed  the starter broach  and was shown to go down the center of the canal. Broaching  with the  Corail system was then performed starting at size 8, coursing  Up to size 9. A size 9 had excellent torsional and rotational  and axial stability. The trial high offset neck was then placed   with a 28 + 5 trial head. The hip was then reduced. We confirmed that  the stem was in the canal both on AP and lateral x-rays. It also has excellent sizing. The hip was reduced with outstanding stability through full extension and full external rotation.. AP pelvis was taken and the leg lengths were measured and found to be equal. Hip was then dislocated again and the femoral head and neck removed. The  femoral broach was removed. Size 9 Corail stem with a high offset  neck was then impacted into the femur following native anteversion. Has  excellent purchase in the canal. Excellent torsional and rotational and  axial stability. It is confirmed to be in the canal on AP and lateral  fluoroscopic views. The 28 + 5 ceamic head was placed and the hip  reduced with outstanding stability. Again AP pelvis was taken and it  confirmed that the leg lengths were equal. The wound was then copiously  irrigated with saline solution and the capsule reattached and repaired  with Ethibond suture. 30 ml of .25% Bupivicaine was  injected into the capsule and into the edge of the tensor fascia lata as well as subcutaneous tissue. The fascia overlying the tensor fascia lata was then closed with a running #1 V-Loc. Subcu was closed with interrupted 2-0 Vicryl and subcuticular running 4-0 Monocryl. Incision was cleaned  and dried. Steri-Strips and a bulky sterile dressing applied. Hemovac  drain was hooked to suction and then the patient was awakened and transported to  recovery in stable condition.        Please note that a surgical assistant was a medical necessity for this procedure to perform it in a safe and expeditious manner. Assistant was necessary to provide appropriate retraction of vital neurovascular structures and to prevent femoral fracture and allow for anatomic placement of the prosthesis.  Gaynelle Arabian, M.D.

## 2017-12-03 NOTE — Anesthesia Preprocedure Evaluation (Signed)
Anesthesia Evaluation  Patient identified by MRN, date of birth, ID band Patient awake    Reviewed: Allergy & Precautions, NPO status , Patient's Chart, lab work & pertinent test results  History of Anesthesia Complications (+) PONV  Airway Mallampati: II  TM Distance: >3 FB Neck ROM: Full    Dental no notable dental hx.    Pulmonary neg pulmonary ROS, former smoker,    Pulmonary exam normal breath sounds clear to auscultation       Cardiovascular negative cardio ROS Normal cardiovascular exam Rhythm:Regular Rate:Normal     Neuro/Psych negative neurological ROS  negative psych ROS   GI/Hepatic negative GI ROS, Neg liver ROS,   Endo/Other  negative endocrine ROS  Renal/GU negative Renal ROS  negative genitourinary   Musculoskeletal negative musculoskeletal ROS (+) Arthritis ,   Abdominal   Peds negative pediatric ROS (+)  Hematology negative hematology ROS (+)   Anesthesia Other Findings   Reproductive/Obstetrics negative OB ROS                             Lab Results  Component Value Date   WBC 7.5 11/27/2017   HGB 13.1 11/27/2017   HCT 39.2 11/27/2017   MCV 94.7 11/27/2017   PLT 345 11/27/2017     Anesthesia Physical Anesthesia Plan  ASA: II  Anesthesia Plan: Spinal   Post-op Pain Management:    Induction: Intravenous  PONV Risk Score and Plan: 3 and Treatment may vary due to age or medical condition, Ondansetron, Dexamethasone and Midazolam  Airway Management Planned: Mask and Natural Airway  Additional Equipment:   Intra-op Plan:   Post-operative Plan:   Informed Consent: I have reviewed the patients History and Physical, chart, labs and discussed the procedure including the risks, benefits and alternatives for the proposed anesthesia with the patient or authorized representative who has indicated his/her understanding and acceptance.     Plan Discussed  with: CRNA  Anesthesia Plan Comments:         Anesthesia Quick Evaluation

## 2017-12-03 NOTE — Transfer of Care (Signed)
Immediate Anesthesia Transfer of Care Note  Patient: Rachel Fleming  Procedure(s) Performed: RIGHT TOTAL HIP ARTHROPLASTY ANTERIOR APPROACH (Right Hip)  Patient Location: PACU  Anesthesia Type:Spinal  Level of Consciousness: awake, alert  and oriented  Airway & Oxygen Therapy: Patient Spontanous Breathing and Patient connected to face mask oxygen  Post-op Assessment: Report given to RN and Post -op Vital signs reviewed and stable  Post vital signs: Reviewed and stable  Last Vitals:  Vitals:   12/03/17 0947  BP: 132/89  Pulse: 99  Resp: 16  Temp: 36.8 C  SpO2: 97%    Last Pain:  Vitals:   12/03/17 0947  TempSrc: Oral      Patients Stated Pain Goal: 4 (51/89/84 2103)  Complications: No apparent anesthesia complications

## 2017-12-03 NOTE — Interval H&P Note (Signed)
History and Physical Interval Note:  12/03/2017 10:03 AM  Rachel Fleming  has presented today for surgery, with the diagnosis of Osteoarthritis Right Hip  The various methods of treatment have been discussed with the patient and family. After consideration of risks, benefits and other options for treatment, the patient has consented to  Procedure(s): RIGHT TOTAL HIP ARTHROPLASTY ANTERIOR APPROACH (Right) as a surgical intervention .  The patient's history has been reviewed, patient examined, no change in status, stable for surgery.  I have reviewed the patient's chart and labs.  Questions were answered to the patient's satisfaction.     [name] [name]

## 2017-12-03 NOTE — Anesthesia Procedure Notes (Signed)
Spinal  Patient location during procedure: OR Start time: 12/03/2017 11:30 AM End time: 12/03/2017 11:36 AM Staffing Anesthesiologist: Barnet Glasgow, MD Performed: anesthesiologist  Preanesthetic Checklist Completed: patient identified, site marked, pre-op evaluation, timeout performed, IV checked, risks and benefits discussed and monitors and equipment checked Spinal Block Patient position: sitting Prep: ChloraPrep Patient monitoring: heart rate, cardiac monitor, continuous pulse ox and blood pressure Approach: midline Location: L3-4 Injection technique: single-shot Needle Needle type: Pencan  Needle gauge: 24 G Needle length: 9 cm Assessment Sensory level: T4 Additional Notes Pt tolerated procedure well

## 2017-12-03 NOTE — Anesthesia Postprocedure Evaluation (Signed)
Anesthesia Post Note  Patient: Rachel Fleming  Procedure(s) Performed: RIGHT TOTAL HIP ARTHROPLASTY ANTERIOR APPROACH (Right Hip)     Patient location during evaluation: PACU Anesthesia Type: Spinal Level of consciousness: oriented and awake and alert Pain management: pain level controlled Vital Signs Assessment: post-procedure vital signs reviewed and stable Respiratory status: spontaneous breathing, respiratory function stable and patient connected to nasal cannula oxygen Cardiovascular status: blood pressure returned to baseline and stable Postop Assessment: no headache, no backache and no apparent nausea or vomiting Anesthetic complications: no    Last Vitals:  Vitals:   12/03/17 1345 12/03/17 1400  BP: 113/73 102/63  Pulse: 79 67  Resp: (!) 21 16  Temp:    SpO2: 98% 100%    Last Pain:  Vitals:   12/03/17 1400  TempSrc:   PainSc: 10-Worst pain ever    LLE Motor Response: Purposeful movement (12/03/17 1400) LLE Sensation: Full sensation (12/03/17 1400) RLE Motor Response: Purposeful movement (12/03/17 1400) RLE Sensation: Pain (12/03/17 1400) L Sensory Level: S2-Posterior medial thigh and lower leg (12/03/17 1400) R Sensory Level: S1-Sole of foot, small toes (12/03/17 1400)  Barnet Glasgow

## 2017-12-04 LAB — BASIC METABOLIC PANEL
ANION GAP: 6 (ref 5–15)
BUN: 14 mg/dL (ref 6–20)
CALCIUM: 8.8 mg/dL — AB (ref 8.9–10.3)
CHLORIDE: 105 mmol/L (ref 101–111)
CO2: 28 mmol/L (ref 22–32)
Creatinine, Ser: 0.45 mg/dL (ref 0.44–1.00)
GFR calc Af Amer: >60 mL/min (ref >60)
GFR calc non Af Amer: >60 mL/min (ref >60)
Glucose, Bld: 116 mg/dL — ABNORMAL HIGH (ref 65–99)
Potassium: 4 mmol/L (ref 3.5–5.1)
SODIUM: 139 mmol/L (ref 135–145)

## 2017-12-04 LAB — CBC
HEMATOCRIT: 29.9 % — AB (ref 36.0–46.0)
HEMOGLOBIN: 10 g/dL — AB (ref 12.0–15.0)
MCH: 31.6 pg (ref 26.0–34.0)
MCHC: 33.4 g/dL (ref 30.0–36.0)
MCV: 94.6 fL (ref 78.0–100.0)
Platelets: 270 10*3/uL (ref 150–400)
RBC: 3.16 MIL/uL — ABNORMAL LOW (ref 3.87–5.11)
RDW: 13.6 % (ref 11.5–15.5)
WBC: 14.1 10*3/uL — AB (ref 4.0–10.5)

## 2017-12-04 MED ORDER — OXYCODONE HCL 5 MG PO TABS: 5.0000 mg | ORAL_TABLET | ORAL | 0 refills | Status: DC | PRN

## 2017-12-04 MED ORDER — RIVAROXABAN 10 MG PO TABS: 10.0000 mg | ORAL_TABLET | Freq: Every day | ORAL | 0 refills | Status: DC

## 2017-12-04 MED ORDER — TRAMADOL HCL 50 MG PO TABS: 50.0000 mg | ORAL_TABLET | Freq: Four times a day (QID) | ORAL | 0 refills | Status: DC | PRN

## 2017-12-04 MED ORDER — METHOCARBAMOL 500 MG PO TABS: 500.0000 mg | ORAL_TABLET | Freq: Four times a day (QID) | ORAL | 0 refills | Status: DC | PRN

## 2017-12-04 NOTE — Evaluation (Signed)
Physical Therapy Evaluation Patient Details Name: Rachel Fleming MRN: [id number] DOB: [birth year] Today's Date: 12/04/2017   History of Present Illness  Pt s/p R THR  Clinical Impression  Pt s/p R THR and presents with decreased R LE strength/ROM and post op pain limiting functional mobility.  Pt should progress well to dc home with family assist and HHPT follow up.    Follow Up Recommendations Home health PT    Equipment Recommendations  None recommended by PT    Recommendations for Other Services       Precautions / Restrictions Precautions Precautions: Fall Restrictions Weight Bearing Restrictions: No Other Position/Activity Restrictions: WBAT      Mobility  Bed Mobility Overal bed mobility: Needs Assistance Bed Mobility: Supine to Sit;Sit to Supine     Supine to sit: Min guard Sit to supine: Min guard   General bed mobility comments: cues for sequence and use of L LE to self assist  Transfers Overall transfer level: Needs assistance Equipment used: Rolling walker (2 wheeled) Transfers: Sit to/from Stand Sit to Stand: Min guard         General transfer comment: cues for LE management and use of UEs to self assist  Ambulation/Gait Ambulation/Gait assistance: Min assist;Min guard Ambulation Distance (Feet): 180 Feet Assistive device: Rolling walker (2 wheeled) Gait Pattern/deviations: Step-to pattern;Step-through pattern;Decreased step length - right;Decreased step length - left;Shuffle;Trunk flexed Gait velocity: decr Gait velocity interpretation: Below normal speed for age/gender General Gait Details: cues for posture, position from RW and initial sequence.  Stairs            Wheelchair Mobility    Modified Rankin (Stroke Patients Only)       Balance                                             Pertinent Vitals/Pain Pain Assessment: 0-10 Pain Score: 5  Pain Location: R hip Pain Descriptors / Indicators:  Aching;Sore Pain Intervention(s): Limited activity within patient's tolerance;Monitored during session;Premedicated before session;Ice applied    Home Living Family/patient expects to be discharged to:: Private residence Living Arrangements: Spouse/significant other Available Help at Discharge: Family Type of Home: House Home Access: Stairs to enter Entrance Stairs-Rails: None Entrance Stairs-Number of Steps: 2 Home Layout: One level Home Equipment: Environmental consultant - 2 wheels;Bedside commode;Cane - single point      Prior Function Level of Independence: Independent               Hand Dominance        Extremity/Trunk Assessment   Upper Extremity Assessment Upper Extremity Assessment: Overall WFL for tasks assessed    Lower Extremity Assessment Lower Extremity Assessment: RLE deficits/detail RLE Deficits / Details: Strength at hip 2+/5 with AAROM at hip to 85 flex and 15 abd    Cervical / Trunk Assessment Cervical / Trunk Assessment: Normal  Communication   Communication: No difficulties  Cognition Arousal/Alertness: Awake/alert Behavior During Therapy: WFL for tasks assessed/performed Overall Cognitive Status: Within Functional Limits for tasks assessed                                        General Comments      Exercises Total Joint Exercises Ankle Circles/Pumps: AROM;Both;20 reps;Supine Quad Sets: AROM;Both;10 reps;Supine Heel Slides: AAROM;Right;20 reps;Supine Hip  ABduction/ADduction: AAROM;Right;15 reps;Supine   Assessment/Plan    PT Assessment Patient needs continued PT services  PT Problem List Decreased strength;Decreased range of motion;Decreased activity tolerance;Decreased mobility;Decreased knowledge of use of DME;Pain       PT Treatment Interventions DME instruction;Gait training;Stair training;Functional mobility training;Therapeutic activities;Therapeutic exercise;Patient/family education    PT Goals (Current goals can be found  in the Care Plan section)  Acute Rehab PT Goals Patient Stated Goal: Regain IND PT Goal Formulation: With patient Time For Goal Achievement: 12/06/17 Potential to Achieve Goals: Good    Frequency 7X/week   Barriers to discharge        Co-evaluation               AM-PAC PT "6 Clicks" Daily Activity  Outcome Measure Difficulty turning over in bed (including adjusting bedclothes, sheets and blankets)?: A Lot Difficulty moving from lying on back to sitting on the side of the bed? : A Lot Difficulty sitting down on and standing up from a chair with arms (e.g., wheelchair, bedside commode, etc,.)?: A Little Help needed moving to and from a bed to chair (including a wheelchair)?: A Little Help needed walking in hospital room?: A Little Help needed climbing 3-5 steps with a railing? : A Little 6 Click Score: 16    End of Session Equipment Utilized During Treatment: Gait belt Activity Tolerance: Patient tolerated treatment well Patient left: in chair;with call bell/phone within reach;with family/visitor present Nurse Communication: Mobility status PT Visit Diagnosis: Difficulty in walking, not elsewhere classified (R26.2)    Time: 1050-1120 PT Time Calculation (min) (ACUTE ONLY): 30 min   Charges:   PT Evaluation $PT Eval Low Complexity: 1 Low PT Treatments $Therapeutic Exercise: 8-22 mins   PT G Codes:        Pg [phone]   Jahziel Sinn 12/04/2017, 1:49 PM

## 2017-12-04 NOTE — Progress Notes (Signed)
Discharge planning, spoke with patient and spouse at bedside. Have chosen [facility] for HH PT, evaluate and treat. Contacted [facility] for referral. Has RW and 3n1. [phone] 

## 2017-12-04 NOTE — Progress Notes (Signed)
Physical Therapy Treatment Patient Details Name: Rachel Fleming MRN: [id number] DOB: May 13, 1956 Today's Date: 12/04/2017    History of Present Illness Pt s/p R THR    PT Comments    Pt progressing well with mobility and eager for return home.  Reviewed home therex, stairs and car transfers.   Follow Up Recommendations  Home health PT     Equipment Recommendations  None recommended by PT    Recommendations for Other Services       Precautions / Restrictions Precautions Precautions: Fall Restrictions Weight Bearing Restrictions: No Other Position/Activity Restrictions: WBAT    Mobility  Bed Mobility Overal bed mobility: Needs Assistance Bed Mobility: Supine to Sit;Sit to Supine     Supine to sit: Min guard Sit to supine: Min guard   General bed mobility comments: cues for sequence and use of L LE to self assist  Transfers Overall transfer level: Needs assistance Equipment used: Rolling walker (2 wheeled) Transfers: Sit to/from Stand Sit to Stand: Min guard;Supervision         General transfer comment: min cues for LE management and use of UEs to self assist  Ambulation/Gait Ambulation/Gait assistance: Min guard;Supervision Ambulation Distance (Feet): 140 Feet Assistive device: Rolling walker (2 wheeled) Gait Pattern/deviations: Step-to pattern;Step-through pattern;Decreased step length - right;Decreased step length - left;Shuffle;Trunk flexed Gait velocity: decr Gait velocity interpretation: Below normal speed for age/gender General Gait Details: min cues for posture, position from RW and initial sequence.   Stairs Stairs: Yes   Stair Management: No rails;Forwards;Step to pattern;Backwards Number of Stairs: 6 General stair comments: 2 step fwd twice with RW at top and using door frame.  2 step bkwd with RW.  Spouse educated on Water quality scientist Rankin (Stroke Patients Only)       Balance                                             Cognition Arousal/Alertness: Awake/alert Behavior During Therapy: WFL for tasks assessed/performed Overall Cognitive Status: Within Functional Limits for tasks assessed                                        Exercises Total Joint Exercises Ankle Circles/Pumps: AROM;Both;20 reps;Supine Quad Sets: AROM;Both;10 reps;Supine Heel Slides: AAROM;Right;20 reps;Supine Hip ABduction/ADduction: AAROM;Right;15 reps;Supine    General Comments        Pertinent Vitals/Pain Pain Assessment: 0-10 Pain Score: 5  Pain Location: R hip Pain Descriptors / Indicators: Aching;Sore Pain Intervention(s): Limited activity within patient's tolerance;Monitored during session;Ice applied    Home Living Family/patient expects to be discharged to:: Private residence Living Arrangements: Spouse/significant other Available Help at Discharge: Family Type of Home: House Home Access: Stairs to enter Entrance Stairs-Rails: None Home Layout: One level Home Equipment: Environmental consultant - 2 wheels;Bedside commode;Cane - single point      Prior Function Level of Independence: Independent          PT Goals (current goals can now be found in the care plan section) Acute Rehab PT Goals Patient Stated Goal: Regain IND PT Goal Formulation: With patient Time For Goal Achievement: 12/06/17 Potential to Achieve Goals: Good Progress towards PT goals: Progressing toward goals    Frequency    7X/week      PT  Plan Current plan remains appropriate    Co-evaluation              AM-PAC PT "6 Clicks" Daily Activity  Outcome Measure  Difficulty turning over in bed (including adjusting bedclothes, sheets and blankets)?: A Lot Difficulty moving from lying on back to sitting on the side of the bed? : A Lot Difficulty sitting down on and standing up from a chair with arms (e.g., wheelchair, bedside commode, etc,.)?: A Little Help needed moving to and from a bed to chair  (including a wheelchair)?: A Little Help needed walking in hospital room?: A Little Help needed climbing 3-5 steps with a railing? : A Little 6 Click Score: 16    End of Session Equipment Utilized During Treatment: Gait belt Activity Tolerance: Patient tolerated treatment well Patient left: in chair;with call bell/phone within reach;with family/visitor present Nurse Communication: Mobility status PT Visit Diagnosis: Difficulty in walking, not elsewhere classified (R26.2)     Time: 4210-3128 PT Time Calculation (min) (ACUTE ONLY): 27 min  Charges:  $Gait Training: 8-22 mins $Therapeutic Exercise: 8-22 mins $Therapeutic Activity: 8-22 mins                    G Codes:       FV [phone]    Zriyah Kopplin 12/04/2017, 1:53 PM

## 2017-12-04 NOTE — Progress Notes (Signed)
Subjective: 1 Day Post-Op Procedure(s) (LRB): RIGHT TOTAL HIP ARTHROPLASTY ANTERIOR APPROACH (Right) Rachel Fleming reports pain as mild.   Rachel Fleming seen in rounds by Dr. Wynelle Link. Rachel Fleming is well, but has had some minor complaints of pain in the hip, requiring pain medications We will start therapy today.  If they do well with therapy and meets all goals, then will allow home later this afternoon following therapy. Plan is to go Home after hospital stay.  Objective: Vital signs in last 24 hours: Temp:  [97.4 F (36.3 C)-98.4 F (36.9 C)] 98.4 F (36.9 C) (01/10 0600) Pulse Rate:  [64-99] 90 (01/10 0600) Resp:  [12-27] 18 (01/10 0600) BP: (102-132)/(60-89) 104/60 (01/10 0600) SpO2:  [97 %-100 %] 99 % (01/10 0600) Weight:  [58.5 kg (129 lb)] 58.5 kg (129 lb) (01/09 0957)  Intake/Output from previous day:  Intake/Output Summary (Last 24 hours) at 12/04/2017 0721 Last data filed at 12/04/2017 0601 Gross per 24 hour  Intake 4598.75 ml  Output 2110 ml  Net 2488.75 ml    Intake/Output this shift: No intake/output data recorded.  Labs: Recent Labs    12/04/17 0551  HGB 10.0*   Recent Labs    12/04/17 0551  WBC 14.1*  RBC 3.16*  HCT 29.9*  PLT 270   Recent Labs    12/04/17 0551  NA 139  K 4.0  CL 105  CO2 28  BUN 14  CREATININE 0.45  GLUCOSE 116*  CALCIUM 8.8*   No results for input(s): LABPT, INR in the last 72 hours.  EXAM General - Rachel Fleming is Alert, Appropriate and Oriented Extremity - Neurovascular intact Sensation intact distally Dressing - dressing C/D/I Motor Function - intact, moving foot and toes well on exam.  Hemovac pulled without difficulty.  Past Medical History:  Diagnosis Date  . Anginal pain (HCC)    hx of chest pain   . Aortic insufficiency   . Arthritis   . Family history of adverse reaction to anesthesia    mother gets violently nausea and vomits   . Heart murmur    per Rachel Fleming ; was under family related stress; had chest pain and saw  cardiologist , dx with aortic insuffiency and heart murmur by [facility] (see epic resutlt); Rachel Fleming states since that time , no stress and has had no repeat occurrence of chset pain or any other cardiac symptoms outside of occasional SOBOE she experienced when [place] after hur[place] last year   . History of chest pain   . History of hysterectomy 2009  . History of kidney stones 2017  . PONV (postoperative nausea and vomiting)    has used dramamine patch before and it made her feel very sedated     Assessment/Plan: 1 Day Post-Op Procedure(s) (LRB): RIGHT TOTAL HIP ARTHROPLASTY ANTERIOR APPROACH (Right) Principal Problem:   OA (osteoarthritis) of hip  Estimated body mass index is 21.47 kg/m as calculated from the following:   Height as of this encounter: 5\' 5"  (1.651 m).   Weight as of this encounter: 58.5 kg (129 lb). Up with therapy Discharge home with home health  DVT Prophylaxis - Xarelto Weight Bearing As Tolerated right Leg Hemovac Pulled Begin Therapy  If meets goals and able to go home: Up with therapy Diet - Cardiac diet Follow up - in 2 weeks Activity - WBAT Disposition - Home Condition Upon Discharge - Stable D/C Meds - See DC Summary DVT Prophylaxis - Xarelto  Arlee Muslim, PA-C Orthopaedic  Surgery 12/04/2017, 7:21 AM

## 2017-12-04 NOTE — Discharge Summary (Signed)
Physician Discharge Summary   Patient ID: Rachel Fleming MRN: [id number] DOB/AGE: [birth year] 62 y.o.  Admit date: 12/03/2017 Discharge date: 12-04-2017  Primary Diagnosis:  Osteoarthritis of the Right  hip.    Admission Diagnoses:  Past Medical History:  Diagnosis Date  . Anginal pain (HCC)    hx of chest pain   . Aortic insufficiency   . Arthritis   . Family history of adverse reaction to anesthesia    mother gets violently nausea and vomits   . Heart murmur    per patient ; was under family related stress; had chest pain and saw cardiologist , dx with aortic insuffiency and heart murmur by [facility] (see epic resutlt); patient states since that time , no stress and has had no repeat occurrence of chset pain or any other cardiac symptoms outside of occasional SOBOE she experienced when [place] after [place] year   . History of chest pain   . History of hysterectomy 2009  . History of kidney stones 2017  . PONV (postoperative nausea and vomiting)    has used dramamine patch before and it made her feel very sedated    Discharge Diagnoses:   Principal Problem:   OA (osteoarthritis) of hip  Estimated body mass index is 21.47 kg/m as calculated from the following:   Height as of this encounter: '5\' 5"'  (1.651 m).   Weight as of this encounter: 58.5 kg (129 lb).  Procedure(s) (LRB): RIGHT TOTAL HIP ARTHROPLASTY ANTERIOR APPROACH (Right)   Consults: None  HPI: Rachel Fleming is a 62 y.o. female who has advanced end-  stage arthritis of their Right  hip with progressively worsening pain and  dysfunction.The patient has failed nonoperative management and presents for  total hip arthroplasty.    Laboratory Data: Admission on 12/03/2017  Component Date Value Ref Range Status  . WBC 12/04/2017 14.1* 4.0 - 10.5 K/uL Final  . RBC 12/04/2017 3.16* 3.87 - 5.11 MIL/uL Final  . Hemoglobin 12/04/2017 10.0* 12.0 - 15.0 g/dL Final  . HCT 12/04/2017 29.9*  36.0 - 46.0 % Final  . MCV 12/04/2017 94.6  78.0 - 100.0 fL Final  . MCH 12/04/2017 31.6  26.0 - 34.0 pg Final  . MCHC 12/04/2017 33.4  30.0 - 36.0 g/dL Final  . RDW 12/04/2017 13.6  11.5 - 15.5 % Final  . Platelets 12/04/2017 270  150 - 400 K/uL Final  . Sodium 12/04/2017 139  135 - 145 mmol/L Final  . Potassium 12/04/2017 4.0  3.5 - 5.1 mmol/L Final  . Chloride 12/04/2017 105  101 - 111 mmol/L Final  . CO2 12/04/2017 28  22 - 32 mmol/L Final  . Glucose, Bld 12/04/2017 116* 65 - 99 mg/dL Final  . BUN 12/04/2017 14  6 - 20 mg/dL Final  . Creatinine, Ser 12/04/2017 0.45  0.44 - 1.00 mg/dL Final  . Calcium 12/04/2017 8.8* 8.9 - 10.3 mg/dL Final  . GFR calc non Af Amer 12/04/2017 >60  >60 mL/min Final  . GFR calc Af Amer 12/04/2017 >60  >60 mL/min Final   Comment: (NOTE) The eGFR has been calculated using the CKD EPI equation. This calculation has not been validated in all clinical situations. eGFR's persistently <60 mL/min signify possible Chronic Kidney Disease.   Georgiann Hahn gap 12/04/2017 6  5 - 15 Final  Hospital Outpatient Visit on 11/27/2017  Component Date Value Ref Range Status  . MRSA, PCR 11/27/2017 NEGATIVE  NEGATIVE Final  .  Staphylococcus aureus 11/27/2017 NEGATIVE  NEGATIVE Final   Comment: (NOTE) The Xpert SA Assay (FDA approved for NASAL specimens in patients 73 years of age and older), is one component of a comprehensive surveillance program. It is not intended to diagnose infection nor to guide or monitor treatment.   Marland Kitchen aPTT 11/27/2017 28  24 - 36 seconds Final  . WBC 11/27/2017 7.5  4.0 - 10.5 K/uL Final  . RBC 11/27/2017 4.14  3.87 - 5.11 MIL/uL Final  . Hemoglobin 11/27/2017 13.1  12.0 - 15.0 g/dL Final  . HCT 11/27/2017 39.2  36.0 - 46.0 % Final  . MCV 11/27/2017 94.7  78.0 - 100.0 fL Final  . MCH 11/27/2017 31.6  26.0 - 34.0 pg Final  . MCHC 11/27/2017 33.4  30.0 - 36.0 g/dL Final  . RDW 11/27/2017 13.7  11.5 - 15.5 % Final  . Platelets 11/27/2017 345  150 -  400 K/uL Final  . Sodium 11/27/2017 139  135 - 145 mmol/L Final  . Potassium 11/27/2017 3.9  3.5 - 5.1 mmol/L Final  . Chloride 11/27/2017 105  101 - 111 mmol/L Final  . CO2 11/27/2017 27  22 - 32 mmol/L Final  . Glucose, Bld 11/27/2017 90  65 - 99 mg/dL Final  . BUN 11/27/2017 20  6 - 20 mg/dL Final  . Creatinine, Ser 11/27/2017 0.45  0.44 - 1.00 mg/dL Final  . Calcium 11/27/2017 9.6  8.9 - 10.3 mg/dL Final  . Total Protein 11/27/2017 7.1  6.5 - 8.1 g/dL Final  . Albumin 11/27/2017 4.0  3.5 - 5.0 g/dL Final  . AST 11/27/2017 27  15 - 41 U/L Final  . ALT 11/27/2017 22  14 - 54 U/L Final  . Alkaline Phosphatase 11/27/2017 93  38 - 126 U/L Final  . Total Bilirubin 11/27/2017 0.9  0.3 - 1.2 mg/dL Final  . GFR calc non Af Amer 11/27/2017 >60  >60 mL/min Final  . GFR calc Af Amer 11/27/2017 >60  >60 mL/min Final   Comment: (NOTE) The eGFR has been calculated using the CKD EPI equation. This calculation has not been validated in all clinical situations. eGFR's persistently <60 mL/min signify possible Chronic Kidney Disease.   . Anion gap 11/27/2017 7  5 - 15 Final  . Prothrombin Time 11/27/2017 12.8  11.4 - 15.2 seconds Final  . INR 11/27/2017 0.97   Final  . ABO/RH(D) 11/27/2017 O POS   Final  . Antibody Screen 11/27/2017 NEG   Final  . Sample Expiration 11/27/2017 12/06/2017   Final  . Extend sample reason 11/27/2017 NO TRANSFUSIONS OR PREGNANCY IN THE PAST 3 MONTHS   Final  . ABO/RH(D) 11/27/2017 O POS   Final     X-Rays:Dg Pelvis Portable  Result Date: 12/03/2017 CLINICAL DATA:  Right hip replacement EXAM: PORTABLE PELVIS 1-2 VIEWS COMPARISON:  12/03/2017 FINDINGS: Right hip replacement. Prosthesis in satisfactory position alignment. No fracture Moderate to advanced degenerative change left hip joint IMPRESSION: Satisfactory right hip replacement Electronically Signed   By: Franchot Gallo M.D.   On: 12/03/2017 13:59   Dg C-arm 1-60 Min-no Report  Result Date: 12/03/2017 Fluoroscopy  was utilized by the requesting physician.  No radiographic interpretation.    EKG: Orders placed or performed in visit on 12/12/11  . EKG 12-Lead     Hospital Course: Patient was admitted to [facility] and taken to the OR and underwent the above state procedure without complications.  Patient tolerated the procedure well and was  later transferred to the recovery room and then to the orthopaedic floor for postoperative care.  They were given PO and IV analgesics for pain control following their surgery.  They were given 24 hours of postoperative antibiotics of  Anti-infectives (From admission, onward)   Start     Dose/Rate Route Frequency Ordered Stop   12/03/17 1800  ceFAZolin (ANCEF) IVPB 2g/100 mL premix     2 g 200 mL/hr over 30 Minutes Intravenous Every 6 hours 12/03/17 1627 12/04/17 0108   12/03/17 1001  ceFAZolin (ANCEF) 2-4 GM/100ML-% IVPB    Comments:  Harvell, Gwendolyn  : cabinet override      12/03/17 1001 12/03/17 1142   12/03/17 0929  ceFAZolin (ANCEF) IVPB 2g/100 mL premix     2 g 200 mL/hr over 30 Minutes Intravenous On call to O.R. 12/03/17 2952 12/03/17 1212     and started on DVT prophylaxis in the form of Xarelto.   PT and OT were ordered for total hip protocol.  The patient was allowed to be WBAT with therapy. Discharge planning was consulted to help with postop disposition and equipment needs.  Patient had a good night on the evening of surgery.  They started to get up OOB with therapy on day one.  Hemovac drain was pulled without difficulty. Dressing was clean and dry.  Patient was seen in rounds on day one and was ready to go home following to sessions of therapy.  Diet - Cardiac diet Follow up - in 2 weeks Activity - WBAT Disposition - Home Condition Upon Discharge - Stable D/C Meds - See DC Summary DVT Prophylaxis - Xarelto    Discharge Instructions    Call MD / Call 911   Complete by:  As directed    If you experience chest pain or shortness of  breath, CALL 911 and be transported to the hospital emergency room.  If you develope a fever above 101 F, pus (white drainage) or increased drainage or redness at the wound, or calf pain, call your surgeon's office.   Change dressing   Complete by:  As directed    Change dressing daily with sterile 4 x 4 inch gauze dressing and apply TED hose. Do not submerge the incision under water.   Constipation Prevention   Complete by:  As directed    Drink plenty of fluids.  Prune juice may be helpful.  You may use a stool softener, such as Colace (over the counter) 100 mg twice a day.  Use MiraLax (over the counter) for constipation as needed.   Diet - low sodium heart healthy   Complete by:  As directed    Discharge instructions   Complete by:  As directed    Take Xarelto for two and a half more weeks, then discontinue Xarelto. Once the patient has completed the blood thinner regimen, then take a Baby 81 mg Aspirin daily for three more weeks.   Pick up stool softner and laxative for home use following surgery while on pain medications. Do not submerge incision under water. Please use good hand washing techniques while changing dressing each day. May shower starting three days after surgery. Please use a clean towel to pat the incision dry following showers. Continue to use ice for pain and swelling after surgery. Do not use any lotions or creams on the incision until instructed by your surgeon.  Wear both TED hose on both legs during the day every day for three weeks, but may remove the  TED hose at night at home.  Postoperative Constipation Protocol  Constipation - defined medically as fewer than three stools per week and severe constipation as less than one stool per week.  One of the most common issues patients have following surgery is constipation.  Even if you have a regular bowel pattern at home, your normal regimen is likely to be disrupted due to multiple reasons following surgery.   Combination of anesthesia, postoperative narcotics, change in appetite and fluid intake all can affect your bowels.  In order to avoid complications following surgery, here are some recommendations in order to help you during your recovery period.  Colace (docusate) - Pick up an over-the-counter form of Colace or another stool softener and take twice a day as long as you are requiring postoperative pain medications.  Take with a full glass of water daily.  If you experience loose stools or diarrhea, hold the colace until you stool forms back up.  If your symptoms do not get better within 1 week or if they get worse, check with your doctor.  Dulcolax (bisacodyl) - Pick up over-the-counter and take as directed by the product packaging as needed to assist with the movement of your bowels.  Take with a full glass of water.  Use this product as needed if not relieved by Colace only.   MiraLax (polyethylene glycol) - Pick up over-the-counter to have on hand.  MiraLax is a solution that will increase the amount of water in your bowels to assist with bowel movements.  Take as directed and can mix with a glass of water, juice, soda, coffee, or tea.  Take if you go more than two days without a movement. Do not use MiraLax more than once per day. Call your doctor if you are still constipated or irregular after using this medication for 7 days in a row.  If you continue to have problems with postoperative constipation, please contact the office for further assistance and recommendations.  If you experience "the worst abdominal pain ever" or develop nausea or vomiting, please contact the office immediatly for further recommendations for treatment.   Do not put a pillow under the knee. Place it under the heel.   Complete by:  As directed    Do not sit on low chairs, stoools or toilet seats, as it may be difficult to get up from low surfaces   Complete by:  As directed    Driving restrictions   Complete by:  As  directed    No driving until released by the physician.   Increase activity slowly as tolerated   Complete by:  As directed    Lifting restrictions   Complete by:  As directed    No lifting until released by the physician.   Patient may shower   Complete by:  As directed    You may shower without a dressing once there is no drainage.  Do not wash over the wound.  If drainage remains, do not shower until drainage stops.   TED hose   Complete by:  As directed    Use stockings (TED hose) for 3 weeks on both leg(s).  You may remove them at night for sleeping.   Weight bearing as tolerated   Complete by:  As directed    Laterality:  right   Extremity:  Lower     Allergies as of 12/04/2017      Reactions   Betadine [povidone Iodine] Other (See Comments)   Anxiety,  Makes her climb the walls      Medication List    STOP taking these medications   BIOFREEZE EX   HYDROcodone-acetaminophen 5-325 MG tablet Commonly known as:  NORCO/VICODIN   multivitamin per tablet   naproxen sodium 220 MG tablet Commonly known as:  ALEVE   NON FORMULARY   OVER THE COUNTER MEDICATION     TAKE these medications   methocarbamol 500 MG tablet Commonly known as:  ROBAXIN Take 1 tablet (500 mg total) by mouth every 6 (six) hours as needed for muscle spasms.   ondansetron 8 MG disintegrating tablet Commonly known as:  ZOFRAN ODT Take 1 tablet (8 mg total) by mouth every 8 (eight) hours as needed for nausea or vomiting.   oxyCODONE 5 MG immediate release tablet Commonly known as:  Oxy IR/ROXICODONE Take 1-2 tablets (5-10 mg total) by mouth every 4 (four) hours as needed for moderate pain or severe pain.   promethazine 12.5 MG tablet Commonly known as:  PHENERGAN Take 1 tablet (12.5 mg total) by mouth every 6 (six) hours as needed for nausea or vomiting.   rivaroxaban 10 MG Tabs tablet Commonly known as:  XARELTO Take 1 tablet (10 mg total) by mouth daily with breakfast. Take Xarelto for  two and a half more weeks following discharge from the hospital, then discontinue Xarelto. Once the patient has completed the blood thinner regimen, then take a Baby 81 mg Aspirin daily for three more weeks.   tamsulosin 0.4 MG Caps capsule Commonly known as:  FLOMAX Take 1 capsule (0.4 mg total) by mouth daily.   traMADol 50 MG tablet Commonly known as:  ULTRAM Take 1-2 tablets (50-100 mg total) by mouth every 6 (six) hours as needed (mild pain).            Discharge Care Instructions  (From admission, onward)        Start     Ordered   12/04/17 0000  Weight bearing as tolerated    Question Answer Comment  Laterality right   Extremity Lower      12/04/17 0730   12/04/17 0000  Change dressing    Comments:  Change dressing daily with sterile 4 x 4 inch gauze dressing and apply TED hose. Do not submerge the incision under water.   12/04/17 0730     Follow-up Information    Gaynelle Arabian, MD. Schedule an appointment as soon as possible for a visit on 12/16/2017.   Specialty:  Orthopedic Surgery Contact information: [address] [place] [address] [phone]           Signed: Arlee Muslim, PA-C Orthopaedic Surgery 12/04/2017, 7:31 AM

## 2017-12-04 NOTE — Discharge Instructions (Signed)
° °Dr. Frank Aluisio °Total Joint Specialist °[facility] °[address] °, [place] [address] °[phone] ° °ANTERIOR APPROACH TOTAL HIP REPLACEMENT POSTOPERATIVE DIRECTIONS ° ° °Hip Rehabilitation, Guidelines Following Surgery  °The results of a hip operation are greatly improved after range of motion and muscle strengthening exercises. Follow all safety measures which are given to protect your hip. If any of these exercises cause increased pain or swelling in your joint, decrease the amount until you are comfortable again. Then slowly increase the exercises. Call your caregiver if you have problems or questions.  ° °HOME CARE INSTRUCTIONS  °Remove items at home which could result in a fall. This includes throw rugs or furniture in walking pathways.  °· ICE to the affected hip every three hours for 30 minutes at a time and then as needed for pain and swelling.  Continue to use ice on the hip for pain and swelling from surgery. You may notice swelling that will progress down to the foot and ankle.  This is normal after surgery.  Elevate the leg when you are not up walking on it.   °· Continue to use the breathing machine which will help keep your temperature down.  It is common for your temperature to cycle up and down following surgery, especially at night when you are not up moving around and exerting yourself.  The breathing machine keeps your lungs expanded and your temperature down. ° ° °DIET °You may resume your previous home diet once your are discharged from the hospital. ° °DRESSING / WOUND CARE / SHOWERING °You may shower 3 days after surgery, but keep the wounds dry during showering.  You may use an occlusive plastic wrap (Press'n Seal for example), NO SOAKING/SUBMERGING IN THE BATHTUB.  If the bandage gets wet, change with a clean dry gauze.  If the incision gets wet, pat the wound dry with a clean towel. °You may start showering once you are discharged home but do not  submerge the incision under water. Just pat the incision dry and apply a dry gauze dressing on daily. °Change the surgical dressing daily and reapply a dry dressing each time. ° °ACTIVITY °Walk with your walker as instructed. °Use walker as long as suggested by your caregivers. °Avoid periods of inactivity such as sitting longer than an hour when not asleep. This helps prevent blood clots.  °You may resume a sexual relationship in one month or when given the OK by your doctor.  °You may return to work once you are cleared by your doctor.  °Do not drive a car for 6 weeks or until released by you surgeon.  °Do not drive while taking narcotics. ° °WEIGHT BEARING °Weight bearing as tolerated with assist device (walker, cane, etc) as directed, use it as long as suggested by your surgeon or therapist, typically at least 4-6 weeks. ° °POSTOPERATIVE CONSTIPATION PROTOCOL °Constipation - defined medically as fewer than three stools per week and severe constipation as less than one stool per week. ° °One of the most common issues patients have following surgery is constipation.  Even if you have a regular bowel pattern at home, your normal regimen is likely to be disrupted due to multiple reasons following surgery.  Combination of anesthesia, postoperative narcotics, change in appetite and fluid intake all can affect your bowels.  In order to avoid complications following surgery, here are some recommendations in order to help you during your recovery period. ° °Colace (docusate) - Pick up an over-the-counter   form of Colace or another stool softener and take twice a day as long as you are requiring postoperative pain medications.  Take with a full glass of water daily.  If you experience loose stools or diarrhea, hold the colace until you stool forms back up.  If your symptoms do not get better within 1 week or if they get worse, check with your doctor. ° °Dulcolax (bisacodyl) - Pick up over-the-counter and take as directed  by the product packaging as needed to assist with the movement of your bowels.  Take with a full glass of water.  Use this product as needed if not relieved by Colace only.  ° °MiraLax (polyethylene glycol) - Pick up over-the-counter to have on hand.  MiraLax is a solution that will increase the amount of water in your bowels to assist with bowel movements.  Take as directed and can mix with a glass of water, juice, soda, coffee, or tea.  Take if you go more than two days without a movement. °Do not use MiraLax more than once per day. Call your doctor if you are still constipated or irregular after using this medication for 7 days in a row. ° °If you continue to have problems with postoperative constipation, please contact the office for further assistance and recommendations.  If you experience "the worst abdominal pain ever" or develop nausea or vomiting, please contact the office immediatly for further recommendations for treatment. ° °ITCHING ° If you experience itching with your medications, try taking only a single pain pill, or even half a pain pill at a time.  You can also use Benadryl over the counter for itching or also to help with sleep.  ° °TED HOSE STOCKINGS °Wear the elastic stockings on both legs for three weeks following surgery during the day but you may remove then at night for sleeping. ° °MEDICATIONS °See your medication summary on the “After Visit Summary” that the nursing staff will review with you prior to discharge.  You may have some home medications which will be placed on hold until you complete the course of blood thinner medication.  It is important for you to complete the blood thinner medication as prescribed by your surgeon.  Continue your approved medications as instructed at time of discharge. ° °PRECAUTIONS °If you experience chest pain or shortness of breath - call 911 immediately for transfer to the hospital emergency department.  °If you develop a fever greater that 101 F,  purulent drainage from wound, increased redness or drainage from wound, foul odor from the wound/dressing, or calf pain - CONTACT YOUR SURGEON.   °                                                °FOLLOW-UP APPOINTMENTS °Make sure you keep all of your appointments after your operation with your surgeon and caregivers. You should call the office at the above phone number and make an appointment for approximately two weeks after the date of your surgery or on the date instructed by your surgeon outlined in the "After Visit Summary". ° °RANGE OF MOTION AND STRENGTHENING EXERCISES  °These exercises are designed to help you keep full movement of your hip joint. Follow your caregiver's or physical therapist's instructions. Perform all exercises about fifteen times, three times per day or as directed. Exercise both hips, even if you   have had only one joint replacement. These exercises can be done on a training (exercise) mat, on the floor, on a table or on a bed. Use whatever works the best and is most comfortable for you. Use music or television while you are exercising so that the exercises are a pleasant break in your day. This will make your life better with the exercises acting as a break in routine you can look forward to.  Lying on your back, slowly slide your foot toward your buttocks, raising your knee up off the floor. Then slowly slide your foot back down until your leg is straight again.  Lying on your back spread your legs as far apart as you can without causing discomfort.  Lying on your side, raise your upper leg and foot straight up from the floor as far as is comfortable. Slowly lower the leg and repeat.  Lying on your back, tighten up the muscle in the front of your thigh (quadriceps muscles). You can do this by keeping your leg straight and trying to raise your heel off the floor. This helps strengthen the largest muscle supporting your knee.  Lying on your back, tighten up the muscles of your  buttocks both with the legs straight and with the knee bent at a comfortable angle while keeping your heel on the floor.   IF YOU ARE TRANSFERRED TO A SKILLED REHAB FACILITY If the patient is transferred to a skilled rehab facility following release from the hospital, a list of the current medications will be sent to the facility for the patient to continue.  When discharged from the skilled rehab facility, please have the facility set up the patient's [place] prior to being released. Also, the skilled facility will be responsible for providing the patient with their medications at time of release from the facility to include their pain medication, the muscle relaxants, and their blood thinner medication. If the patient is still at the rehab facility at time of the two week follow up appointment, the skilled rehab facility will also need to assist the patient in arranging follow up appointment in our office and any transportation needs.  MAKE SURE YOU:  Understand these instructions.  Get help right away if you are not doing well or get worse.    Pick up stool softner and laxative for home use following surgery while on pain medications. Do not submerge incision under water. Please use good hand washing techniques while changing dressing each day. May shower starting three days after surgery. Please use a clean towel to pat the incision dry following showers. Continue to use ice for pain and swelling after surgery. Do not use any lotions or creams on the incision until instructed by your surgeon.  Take Xarelto for two and a half more weeks following discharge from the hospital, then discontinue Xarelto. Once the patient has completed the blood thinner regimen, then take a Baby 81 mg Aspirin daily for three more weeks.

## 2017-12-05 ENCOUNTER — Encounter (HOSPITAL_COMMUNITY): Payer: Self-pay | Admitting: Orthopedic Surgery

## 2018-05-01 ENCOUNTER — Encounter: Payer: Self-pay | Admitting: Gynecology

## 2018-05-01 ENCOUNTER — Inpatient Hospital Stay: Payer: Commercial Managed Care - PPO | Attending: Gynecology | Admitting: Gynecology

## 2018-05-01 VITALS — BP 103/85 | HR 76 | Temp 98.2°F | Resp 20 | Ht 64.0 in | Wt 126.5 lb

## 2018-05-01 DIAGNOSIS — Z90722 Acquired absence of ovaries, bilateral: Secondary | ICD-10-CM | POA: Diagnosis not present

## 2018-05-01 DIAGNOSIS — Z87891 Personal history of nicotine dependence: Secondary | ICD-10-CM | POA: Diagnosis not present

## 2018-05-01 DIAGNOSIS — Z90710 Acquired absence of both cervix and uterus: Secondary | ICD-10-CM | POA: Diagnosis not present

## 2018-05-01 DIAGNOSIS — N83202 Unspecified ovarian cyst, left side: Secondary | ICD-10-CM | POA: Insufficient documentation

## 2018-05-01 DIAGNOSIS — Z8041 Family history of malignant neoplasm of ovary: Secondary | ICD-10-CM | POA: Insufficient documentation

## 2018-05-01 NOTE — Progress Notes (Signed)
H&P   Rachel Fleming 62 y.o. female seen in consultation at the request of Dr. Arnetha Gula regarding management of multicystic ovary.  Because of the patient's family history (grandmother) of ovarian cancer (age 44) the patient has been monitored over the past several years with pelvic ultrasounds and Ca1 25 values.  She has had a persistent cyst of the left ovary which the patient indicates has on occasion gotten larger and on other occasions smaller.  Most recently she had  an ultrasound on Apr 22, 2018 that showed a left ovary with multiple cystic areas measuring 18 x 21, 14 x 10, 17 x 11, 18 x 14, 20 x 13, 22 x 13,.  There is no blood flow seen within the cyst and there is no free fluid.  In aggregate the ovary measures 5.1 x 2.8 x 4.0 cm.  The patient is a Ca1 25 value that returned as 17 units/mL.  The patient denies any pelvic symptoms.  Specifically she denies any pelvic pressure or pain.  She has a past history of undergoing a lap scopic assisted vaginal hysterectomy for pain and abnormal bleeding.  (Age 67) otherwise she denies any other gynecologic history or symptoms.  Family history is otherwise negative for breast ovarian or uterine cancers.  Chief Complaint  Patient presents with  . Left ovarian cyst    Past Medical History:  Diagnosis Date  . Anginal pain (HCC)    hx of chest pain   . Aortic insufficiency   . Arthritis   . Family history of adverse reaction to anesthesia    mother gets violently nausea and vomits   . Heart murmur    per patient ; was under family related stress; had chest pain and saw cardiologist , dx with aortic insuffiency and heart murmur by [facility] (see epic resutlt); patient states since that time , no stress and has had no repeat occurrence of chset pain or any other cardiac symptoms outside of occasional SOBOE she experienced when [place] debris [place] after hurican [place] last year   . History of chest pain   . History of hysterectomy 2009  .  History of kidney stones 2017  . PONV (postoperative nausea and vomiting)    has used dramamine patch before and it made her feel very sedated    Past Surgical History:  Procedure Laterality Date  . ABDOMINAL HYSTERECTOMY    . ABDOMINAL SURGERY  1996   mass remaved  . SPLENECTOMY, TOTAL    . TOTAL HIP ARTHROPLASTY Right 12/03/2017   Procedure: RIGHT TOTAL HIP ARTHROPLASTY ANTERIOR APPROACH;  Surgeon: Gaynelle Arabian, MD;  Location: [facility];  Service: Orthopedics;  Laterality: Right;   Current Outpatient Medications  Medication Sig Dispense Refill  . Multiple Vitamin (MULTIVITAMIN) tablet Take 1 tablet by mouth daily.    Marland Kitchen OVER THE COUNTER MEDICATION 2 tablets as needed. Patient takes two Alieve  A day.    Marland Kitchen OVER THE COUNTER MEDICATION 1 tablet. Insta flex joint     No current facility-administered medications for this visit.    Allergies  Allergen Reactions  . Betadine [Povidone Iodine] Other (See Comments)    Anxiety,  Makes her climb the walls   Social History   Socioeconomic History  . Marital status: Married    Spouse name: Not on file  . Number of children: Not on file  . Years of education: Not on file  . Highest education level: Not on file  Occupational  History  . Not on file  Social Needs  . Financial resource strain: Not on file  . Food insecurity:    Worry: Not on file    Inability: Not on file  . Transportation needs:    Medical: Not on file    Non-medical: Not on file  Tobacco Use  . Smoking status: Former Smoker    Years: 0.00    Types: Cigarettes  . Smokeless tobacco: Never Used  . Tobacco comment: quit at 26   Substance and Sexual Activity  . Alcohol use: No  . Drug use: No  . Sexual activity: Not on file  Lifestyle  . Physical activity:    Days per week: Not on file    Minutes per session: Not on file  . Stress: Not on file  Relationships  . Social connections:    Talks on phone: Not on file    Gets together: Not on file    Attends religious  service: Not on file    Active member of club or organization: Not on file    Attends meetings of clubs or organizations: Not on file    Relationship status: Not on file  . Intimate partner violence:    Fear of current or ex partner: Not on file    Emotionally abused: Not on file    Physically abused: Not on file    Forced sexual activity: Not on file  Other Topics Concern  . Not on file  Social History Narrative  . Not on file   Family History  Problem Relation Age of Onset  . Gallstones Mother   . High Cholesterol Father   . Kidney Stones Father   . Cancer Maternal Uncle   . Ovarian cancer Maternal Grandmother   . Bone cancer Maternal Grandfather   . Leukemia Maternal Grandfather   . Heart disease Paternal Grandmother   . Arthritis Paternal Grandmother       ROS: Vitals:  Blood pressure 103/85, pulse 76, temperature 98.2 F (36.8 C), temperature source Oral, resp. rate 20, height 5\' 4"  (1.626 m), weight 126 lb 8 oz (57.4 kg), SpO2 99 %.  Physical Exam:  HEENT is negative  Neck is supple without thyromegaly  The abdomen is soft nontender no masses organomegaly ascites or hernias are noted.  Pelvic exam:  EGBUS vagina bladder urethra are normal.  Vaginal cuff is well-healed.   On bimanual examination I am unable to palpate any masses.  Lower extremities are without edema or varicosities. Assessment/Plan: Left ovary with multicystic areas that appear benign.  The patient is somewhat frustrated with requiring serial exams and changes and reports over time.  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  Management options could include the following:  1 continued surveillance.  2 just continue surveillance  3 remove the ovary surgically (laparoscopically).  Patient will consider these options at the end of our conversation she is leaning towards going ahead with surgical removal so that she could have peace of mind.  She  returned to Dr. Renold Don for continuing care and I reassured the patient I felt quite confident Dr. Renold Don could manage the surgical procedure without the need for gynecologic oncology intervention.   Marti Sleigh, MD 05/01/2018, 12:38 PM

## 2018-05-01 NOTE — Patient Instructions (Signed)
The options discussed today include removal of the ovaries laparoscopically with Dr. Radene Knee or to stop monitoring the ovarian cysts and CA 125. Please call for any questions or concerns.

## 2018-05-14 NOTE — Progress Notes (Signed)
Cardiology Office Note   Date:  05/15/2018   ID:  Rachel Fleming, DOB [birth year], MRN [id number]  PCP:  Jettie Booze, NP  Cardiologist:   No primary care provider on file. Referring:  Self  Chief Complaint  Patient presents with  . Heart Murmur      History of Present Illness: Rachel Fleming is a 62 y.o. female who is referred by herself for evaluation of AI and MR.   The patient has had these valvular abnormalities noted before on a previous echo in 2013.  She had mild AI and MR.  This was done because she had a murmur.  She was supposed to have follow-up but she never had this done.  She has had some mild squeezing discomfort in her chest at times.  She is noticed this when she is been very active in the yard particularly when she is been [place].  She might get lightheaded.  This has been intermittent.  She will rarely have feeling like her heart is "quivering".  She has not had any presyncope or syncope.  She does not exercise routinely but she stays quite active and does not usually bring on the symptoms.  She does not describe in particular shortness of breath, PND or orthopnea.  She is had no weight gain or edema.    Past Medical History:  Diagnosis Date  . Aortic insufficiency   . Arthritis   . History of kidney stones 2017  . PONV (postoperative nausea and vomiting)    has used dramamine patch before and it made her feel very sedated     Past Surgical History:  Procedure Laterality Date  . ABDOMINAL HYSTERECTOMY    . ABDOMINAL SURGERY  1996   mass remaved  . SPLENECTOMY, TOTAL    . TOTAL HIP ARTHROPLASTY Right 12/03/2017   Procedure: RIGHT TOTAL HIP ARTHROPLASTY ANTERIOR APPROACH;  Surgeon: Gaynelle Arabian, MD;  Location: [facility];  Service: Orthopedics;  Laterality: Right;     Current Outpatient Medications  Medication Sig Dispense Refill  . Multiple Vitamin (MULTIVITAMIN) tablet Take 1 tablet by mouth daily.    Marland Kitchen OVER THE  COUNTER MEDICATION 2 tablets as needed. Patient takes two Alieve  A day.    Marland Kitchen OVER THE COUNTER MEDICATION 1 tablet. Insta flex joint     No current facility-administered medications for this visit.     Allergies:   Betadine [povidone iodine]    Social History:  The patient  reports that she has quit smoking. Her smoking use included cigarettes. She quit after 0.00 years of use. She has never used smokeless tobacco. She reports that she does not drink alcohol or use drugs.   Family History:  The patient's family history includes Arthritis in her paternal grandmother; Bone cancer in her maternal grandfather; Cancer in her maternal uncle; Gallstones in her mother; Heart disease in her paternal grandmother; High Cholesterol in her father; Kidney Stones in her father; Leukemia in her maternal grandfather; Ovarian cancer in her maternal grandmother.    ROS:  Please see the history of present illness.   Otherwise, review of systems are positive for none.   All other systems are reviewed and negative.    PHYSICAL EXAM: VS:  BP 134/76 (BP Location: Left Arm, Patient Position: Sitting, Cuff Size: Normal)   Pulse 70   Ht 5\' 5"  (1.651 m)   Wt 126 lb 9.6 oz (57.4 kg)  BMI 21.07 kg/m  , BMI Body mass index is 21.07 kg/m. GENERAL:  Well appearing, thin HEENT:  Pupils equal round and reactive, fundi not visualized, oral mucosa unremarkable NECK:  No jugular venous distention, waveform within normal limits, carotid upstroke brisk and symmetric, no bruits, no thyromegaly LYMPHATICS:  No cervical, inguinal adenopathy LUNGS:  Clear to auscultation bilaterally BACK:  No CVA tenderness CHEST:  Unremarkable HEART:  PMI not displaced or sustained,S1 and S2 within normal limits, no S3, no S4, no clicks, no rubs, soft 2 out of 6 apical early peaking systolic murmur, no diastolic murmurs ABD:  Flat, positive bowel sounds normal in frequency in pitch, no bruits, no rebound, no guarding, no midline pulsatile  mass, no hepatomegaly, no splenomegaly EXT:  2 plus pulses throughout, no edema, no cyanosis no clubbing SKIN:  No rashes no nodules NEURO:  Cranial nerves II through XII grossly intact, motor grossly intact throughout PSYCH:  Cognitively intact, oriented to person place and time    EKG:  EKG is ordered today. The ekg ordered today demonstrates sinus rhythm, rate 70, axis within normal limits, intervals within normal limits, poor anterior R wave progression.  This is no changed compared to previous.     Recent Labs: 11/27/2017: ALT 22 12/04/2017: BUN 14; Creatinine, Ser 0.45; Hemoglobin 10.0; Platelets 270; Potassium 4.0; Sodium 139    Lipid Panel No results found for: CHOL, TRIG, HDL, CHOLHDL, VLDL, LDLCALC, LDLDIRECT    Wt Readings from Last 3 Encounters:  05/15/18 126 lb 9.6 oz (57.4 kg)  05/01/18 126 lb 8 oz (57.4 kg)  12/03/17 129 lb (58.5 kg)      Other studies Reviewed: Additional studies/ records that were reviewed today include: Previous echo report. Review of the above records demonstrates:  Please see elsewhere in the note.     ASSESSMENT AND PLAN:  AI:  I will check an echo.  Further evaluation will be based on this.    MR:  As above.  CHEST PAIN: This is somewhat atypical.  I am going to start with a coronary calcium score.  Further evaluation will be based on this.  HTN:  Her BP is usually not this high.  She will keep an eye on this.    ELEVATED LDL: She has an excellent triglycerides.  The LDL was mildly elevated at 115.  HDL was excellent.  No change in therapy although targets will be based on the CT score.   Current medicines are reviewed at length with the patient today.  The patient does not have concerns regarding medicines.  The following changes have been made:  no change  Labs/ tests ordered today include:   Orders Placed This Encounter  Procedures  . CT CARDIAC SCORING  . EKG 12-Lead  . ECHOCARDIOGRAM COMPLETE     Disposition:   FU  with me as needed.      Signed, Minus Breeding, MD  05/15/2018 3:29 PM    [facility]

## 2018-05-15 ENCOUNTER — Ambulatory Visit (INDEPENDENT_AMBULATORY_CARE_PROVIDER_SITE_OTHER): Payer: Commercial Managed Care - PPO | Admitting: Cardiology

## 2018-05-15 ENCOUNTER — Encounter: Payer: Self-pay | Admitting: Cardiology

## 2018-05-15 VITALS — BP 134/76 | HR 70 | Ht 65.0 in | Wt 126.6 lb

## 2018-05-15 DIAGNOSIS — I351 Nonrheumatic aortic (valve) insufficiency: Secondary | ICD-10-CM | POA: Diagnosis not present

## 2018-05-15 DIAGNOSIS — R0602 Shortness of breath: Secondary | ICD-10-CM | POA: Diagnosis not present

## 2018-05-15 DIAGNOSIS — I340 Nonrheumatic mitral (valve) insufficiency: Secondary | ICD-10-CM | POA: Diagnosis not present

## 2018-05-15 NOTE — Patient Instructions (Signed)
Medication Instructions:  Continue current medications  If you need a refill on your cardiac medications before your next appointment, please call your pharmacy.  Labwork: None Ordered   Testing/Procedures: Your physician has requested that you have an echocardiogram. Echocardiography is a painless test that uses sound waves to create images of your heart. It provides your doctor with information about the size and shape of your heart and how well your heart's chambers and valves are working. This procedure takes approximately one hour. There are no restrictions for this procedure.  Your physician has requested that you have a Coronary Calcium Score. This test is done at our [facility].   Follow-Up: Your physician wants you to follow-up in: As Needed.     Thank you for choosing [facility] at [facility]!!

## 2018-06-02 ENCOUNTER — Ambulatory Visit: Payer: Commercial Managed Care - PPO | Admitting: Gynecology

## 2018-06-11 ENCOUNTER — Ambulatory Visit (HOSPITAL_COMMUNITY): Payer: Commercial Managed Care - PPO | Attending: Cardiovascular Disease

## 2018-06-11 ENCOUNTER — Other Ambulatory Visit: Payer: Self-pay

## 2018-06-11 ENCOUNTER — Ambulatory Visit (INDEPENDENT_AMBULATORY_CARE_PROVIDER_SITE_OTHER)
Admission: RE | Admit: 2018-06-11 | Discharge: 2018-06-11 | Disposition: A | Discharge status: Home / Self Care | Payer: Self-pay | Source: Ambulatory Visit | Attending: Cardiology | Admitting: Cardiology

## 2018-06-11 DIAGNOSIS — I351 Nonrheumatic aortic (valve) insufficiency: Secondary | ICD-10-CM

## 2018-06-11 DIAGNOSIS — I340 Nonrheumatic mitral (valve) insufficiency: Secondary | ICD-10-CM | POA: Diagnosis not present

## 2018-06-11 DIAGNOSIS — I083 Combined rheumatic disorders of mitral, aortic and tricuspid valves: Secondary | ICD-10-CM | POA: Insufficient documentation

## 2018-06-11 DIAGNOSIS — R0602 Shortness of breath: Secondary | ICD-10-CM

## 2018-06-11 DIAGNOSIS — I509 Heart failure, unspecified: Secondary | ICD-10-CM | POA: Insufficient documentation

## 2018-06-18 NOTE — H&P (Signed)
NAME: Rachel Fleming, Rachel Fleming MEDICAL RECORD [id number] ACCOUNT [id number] DATE OF BIRTH:08-29-1956 FACILITY: [facility] LOCATION:  PHYSICIAN:Symphonie Schneiderman Sherran Needs, MD  HISTORY AND PHYSICAL  DATE OF ADMISSION:  07/09/2018  The date of her surgery is  07/06/2017 at [facility].  HISTORY OF PRESENT ILLNESS:  The patient is a 62 year old gravida 1, para 0, postmenopausal patient who presents for laparoscopic bilateral salpingo-oophorectomy.  In 2006, she had laparoscopic assisted vaginal hysterectomy for large uterine fibroids  with associated symptomatology.  She has persistent cystic margin of the left ovary with normal CA-125.  An ultrasound in May of this year revealed the ovaries have multiple cysts with septations and low level echoes with calcifications.  There was no  blood flow or free fluid.  She was seen in consult by Dr. Carlena Bjornstad who felt this was a benign process.  The patient does wish to have this removed.  Now, she presents for laparoscopic bilateral salpingo-oophorectomy.  ALLERGIES:  In terms of allergies, she is ALLERGIC TO BETADINE.  MEDICATIONS:  She is on Aleve and Instaflex.  PAST MEDICAL HISTORY:  She had a hysterectomy as noted above in 2004.  She has a history of arthritis and other medical conditions.  SOCIAL HISTORY:  Reveals no tobacco or alcohol use.  FAMILY HISTORY:  Noncontributory.  REVIEW OF SYSTEMS:  Noncontributory.  PHYSICAL EXAMINATION: VITAL SIGNS:  Afebrile, stable vital signs. HEENT:  The patient is normocephalic.  Pupils equal, round, reactive to light and accommodation.  Extraocular movements are intact.  Sclerae and conjunctivae clear.  Oropharynx clear. NECK:  Without thyromegaly. BREASTS:  Not examined. LUNGS:  Clear. HEART:  Regular rhythm and rate without murmurs or gallops.  There is no carotid or abdominal  bruits.  Her abdominal exam is benign.  No masses, organomegaly or tenderness.   PELVIC:  External genitalia and  vaginal mucosa is clear.  Cuff intact.  Mild fullness noted in the left adnexa.  Right adnexa clear. EXTREMITIES:  Trace edema. NEUROLOGIC:  Grossly normal limits.  ASSESSMENT:  Cystic enlargement of the left ovary, probably benign process.  PLAN:  The patient will undergo a laparoscopic bilateral salpingo-oophorectomy with possible need for exploratory surgery.  Risk of surgery have been discussed including the risk of infection.  Risk of hemorrhage could require transfusion with the risk  of AIDS or hepatitis.  Risk of injury to adjacent organs including bladder, bowel, ureters.  Risk of deep venous thrombosis and pulmonary embolus.  The patient expressed understanding indication, risks and other alternatives.  AN/NUANCE  D:06/18/2018 T:06/18/2018 JOB:[id number]

## 2018-06-18 NOTE — H&P (Signed)
Patient name Rachel Fleming, Rachel Fleming DICTATION# [id number] CSN# [id number]  [facility], MD 06/18/2018 5:16 AM

## 2018-06-25 HISTORY — PX: SALPINGOOPHORECTOMY: SHX82

## 2018-06-26 ENCOUNTER — Telehealth: Payer: Self-pay

## 2018-06-26 ENCOUNTER — Encounter (HOSPITAL_BASED_OUTPATIENT_CLINIC_OR_DEPARTMENT_OTHER): Payer: Self-pay | Admitting: *Deleted

## 2018-06-26 ENCOUNTER — Other Ambulatory Visit: Payer: Self-pay

## 2018-06-26 NOTE — Telephone Encounter (Signed)
   [facility] Pre-operative Risk Assessment    Request for surgical clearance:  1. What type of surgery is being performed?     2. When is this surgery scheduled? PENDING   3. What type of clearance is required (medical clearance vs. Pharmacy clearance to hold med vs. Both)?  MEDICAL  4. Are there any medications that need to be held prior to surgery and how long? NONE LISTED   5. Practice name and name of physician performing surgery?   [facility]  6. What is your office phone number [phone]    7.   What is your office fax number [phone]  8.   Anesthesia type (None, local, MAC, general) ?  NONE LISTED   Waylan Rocher 06/26/2018, 1:42 PM  _________________________________________________________________   (provider comments below)

## 2018-06-26 NOTE — Telephone Encounter (Signed)
1. What type of surgery is being performed?  LEFT TOTAL HIP ARTHROPLASTY.

## 2018-06-26 NOTE — Progress Notes (Signed)
Spoke with patient via telephone for pre op interview. NPO after MN. No medications morning of surgery. Patient has pre op lab appointment for 07/03/18 @ 9am. Patient to arrive at 0530.

## 2018-06-26 NOTE — Telephone Encounter (Signed)
   Primary Cardiologist: Minus Breeding, MD  Insufficient information to be able to complete the preop assessment. Can you clarify what kind of surgery and what kind of anesthesia?  [name], PA-C 06/26/2018, 2:35 PM

## 2018-06-26 NOTE — Telephone Encounter (Signed)
   Primary Cardiologist: Minus Breeding, MD  Chart reviewed as part of pre-operative protocol coverage. Patient was contacted 06/26/2018 in reference to pre-operative risk assessment for pending surgery as outlined below.  Rachel Fleming was last seen on 05/15/18 by Dr. Percival Fleming. H/o arthritis, rare palpitations, mild aortic insufficiency. Echo reassuring 06/11/2018. No hx CAD -> calacium scoring was normal. RCRI 0.4% indicating low risk of cardiac events. Since that day, Rachel Fleming has done well without any new cardiac symptoms or complaints.   Therefore, based on ACC/AHA guidelines, the patient would be at acceptable risk for the planned procedure without further cardiovascular testing.   I will route this recommendation to the requesting party via Epic fax function and remove from pre-op pool.  Please call with questions.  [name], PA-C 06/26/2018, 3:57 PM

## 2018-07-03 ENCOUNTER — Encounter (HOSPITAL_COMMUNITY)
Admission: RE | Admit: 2018-07-03 | Discharge: 2018-07-03 | Disposition: A | Discharge status: Home / Self Care | Payer: Commercial Managed Care - PPO | Source: Ambulatory Visit | Attending: Obstetrics and Gynecology | Admitting: Obstetrics and Gynecology

## 2018-07-03 DIAGNOSIS — Z01812 Encounter for preprocedural laboratory examination: Secondary | ICD-10-CM | POA: Diagnosis present

## 2018-07-03 LAB — CBC
HCT: 40.6 % (ref 36.0–46.0)
HEMOGLOBIN: 13.4 g/dL (ref 12.0–15.0)
MCH: 31.2 pg (ref 26.0–34.0)
MCHC: 33 g/dL (ref 30.0–36.0)
MCV: 94.6 fL (ref 78.0–100.0)
Platelets: 356 10*3/uL (ref 150–400)
RBC: 4.29 MIL/uL (ref 3.87–5.11)
RDW: 13.5 % (ref 11.5–15.5)
WBC: 6.1 10*3/uL (ref 4.0–10.5)

## 2018-07-09 ENCOUNTER — Encounter (HOSPITAL_BASED_OUTPATIENT_CLINIC_OR_DEPARTMENT_OTHER)
Admission: RE | Disposition: A | Discharge status: Home / Self Care | Payer: Self-pay | Source: Ambulatory Visit | Attending: Obstetrics and Gynecology

## 2018-07-09 ENCOUNTER — Ambulatory Visit (HOSPITAL_BASED_OUTPATIENT_CLINIC_OR_DEPARTMENT_OTHER): Payer: Commercial Managed Care - PPO | Admitting: Anesthesiology

## 2018-07-09 ENCOUNTER — Ambulatory Visit (HOSPITAL_BASED_OUTPATIENT_CLINIC_OR_DEPARTMENT_OTHER)
Admission: RE | Admit: 2018-07-09 | Discharge: 2018-07-09 | Disposition: A | Discharge status: Home / Self Care | Payer: Commercial Managed Care - PPO | Source: Ambulatory Visit | Attending: Obstetrics and Gynecology | Admitting: Obstetrics and Gynecology

## 2018-07-09 ENCOUNTER — Encounter (HOSPITAL_BASED_OUTPATIENT_CLINIC_OR_DEPARTMENT_OTHER): Payer: Self-pay | Admitting: *Deleted

## 2018-07-09 DIAGNOSIS — N83202 Unspecified ovarian cyst, left side: Secondary | ICD-10-CM | POA: Diagnosis present

## 2018-07-09 DIAGNOSIS — D271 Benign neoplasm of left ovary: Secondary | ICD-10-CM | POA: Diagnosis not present

## 2018-07-09 DIAGNOSIS — Z79899 Other long term (current) drug therapy: Secondary | ICD-10-CM | POA: Diagnosis not present

## 2018-07-09 DIAGNOSIS — D270 Benign neoplasm of right ovary: Secondary | ICD-10-CM | POA: Diagnosis not present

## 2018-07-09 DIAGNOSIS — Z87891 Personal history of nicotine dependence: Secondary | ICD-10-CM | POA: Diagnosis not present

## 2018-07-09 DIAGNOSIS — Z883 Allergy status to other anti-infective agents status: Secondary | ICD-10-CM | POA: Insufficient documentation

## 2018-07-09 DIAGNOSIS — Z90710 Acquired absence of both cervix and uterus: Secondary | ICD-10-CM | POA: Diagnosis not present

## 2018-07-09 DIAGNOSIS — Z791 Long term (current) use of non-steroidal anti-inflammatories (NSAID): Secondary | ICD-10-CM | POA: Insufficient documentation

## 2018-07-09 HISTORY — DX: Nonrheumatic aortic (valve) insufficiency: I35.1

## 2018-07-09 HISTORY — PX: LAPAROSCOPIC BILATERAL SALPINGO OOPHERECTOMY: SHX5890

## 2018-07-09 LAB — TYPE AND SCREEN
ABO/RH(D): O POS
Antibody Screen: NEGATIVE

## 2018-07-09 SURGERY — SALPINGO-OOPHORECTOMY, BILATERAL, LAPAROSCOPIC
Anesthesia: General | Laterality: Bilateral

## 2018-07-09 MED ORDER — OXYCODONE HCL 5 MG PO TABS
5.0000 mg | ORAL_TABLET | Freq: Once | ORAL | Status: DC | PRN
  Filled 2018-07-09: qty 1

## 2018-07-09 MED ORDER — SCOPOLAMINE 1 MG/3DAYS TD PT72
MEDICATED_PATCH | TRANSDERMAL | Status: AC
  Filled 2018-07-09: qty 1

## 2018-07-09 MED ORDER — METOCLOPRAMIDE HCL 5 MG/ML IJ SOLN
INTRAMUSCULAR | Status: AC
  Filled 2018-07-09: qty 2

## 2018-07-09 MED ORDER — MEPERIDINE HCL 25 MG/ML IJ SOLN
6.2500 mg | INTRAMUSCULAR | Status: DC | PRN
  Filled 2018-07-09: qty 1

## 2018-07-09 MED ORDER — OXYCODONE HCL 5 MG/5ML PO SOLN
5.0000 mg | Freq: Once | ORAL | Status: DC | PRN
  Filled 2018-07-09: qty 5

## 2018-07-09 MED ORDER — ROCURONIUM BROMIDE 100 MG/10ML IV SOLN
INTRAVENOUS | Status: AC
  Filled 2018-07-09: qty 1

## 2018-07-09 MED ORDER — EPHEDRINE 5 MG/ML INJ
INTRAVENOUS | Status: AC
  Filled 2018-07-09: qty 10

## 2018-07-09 MED ORDER — PROPOFOL 500 MG/50ML IV EMUL
INTRAVENOUS | Status: DC | PRN
  Administered 2018-07-09: 200 ug/kg/min via INTRAVENOUS

## 2018-07-09 MED ORDER — LIDOCAINE 2% (20 MG/ML) 5 ML SYRINGE
INTRAMUSCULAR | Status: AC
  Filled 2018-07-09: qty 5

## 2018-07-09 MED ORDER — ACETAMINOPHEN 10 MG/ML IV SOLN
INTRAVENOUS | Status: DC | PRN
  Administered 2018-07-09: 1000 mg via INTRAVENOUS

## 2018-07-09 MED ORDER — MIDAZOLAM HCL 2 MG/2ML IJ SOLN
INTRAMUSCULAR | Status: DC | PRN
  Administered 2018-07-09: 2 mg via INTRAVENOUS

## 2018-07-09 MED ORDER — LACTATED RINGERS IV SOLN
INTRAVENOUS | Status: DC
  Administered 2018-07-09: 09:00:00 via INTRAVENOUS
  Administered 2018-07-09: 1000 mL via INTRAVENOUS
  Administered 2018-07-09: 08:00:00 via INTRAVENOUS
  Filled 2018-07-09: qty 1000

## 2018-07-09 MED ORDER — EPHEDRINE SULFATE-NACL 50-0.9 MG/10ML-% IV SOSY
PREFILLED_SYRINGE | INTRAVENOUS | Status: DC | PRN
  Administered 2018-07-09: 10 mg via INTRAVENOUS

## 2018-07-09 MED ORDER — HYDROMORPHONE HCL 1 MG/ML IJ SOLN
0.2500 mg | INTRAMUSCULAR | Status: DC | PRN
  Filled 2018-07-09: qty 0.5

## 2018-07-09 MED ORDER — MIDAZOLAM HCL 2 MG/2ML IJ SOLN
INTRAMUSCULAR | Status: AC
  Filled 2018-07-09: qty 2

## 2018-07-09 MED ORDER — ARTIFICIAL TEARS OPHTHALMIC OINT
TOPICAL_OINTMENT | OPHTHALMIC | Status: AC
  Filled 2018-07-09: qty 3.5

## 2018-07-09 MED ORDER — ONDANSETRON HCL 4 MG/2ML IJ SOLN
INTRAMUSCULAR | Status: DC | PRN
  Administered 2018-07-09: 4 mg via INTRAVENOUS

## 2018-07-09 MED ORDER — CEFAZOLIN SODIUM-DEXTROSE 2-4 GM/100ML-% IV SOLN
2.0000 g | INTRAVENOUS | Status: AC
  Administered 2018-07-09: 2 g via INTRAVENOUS
  Filled 2018-07-09: qty 100

## 2018-07-09 MED ORDER — DEXAMETHASONE SODIUM PHOSPHATE 10 MG/ML IJ SOLN
INTRAMUSCULAR | Status: DC | PRN
  Administered 2018-07-09: 10 mg via INTRAVENOUS

## 2018-07-09 MED ORDER — METOCLOPRAMIDE HCL 5 MG/ML IJ SOLN
INTRAMUSCULAR | Status: DC | PRN
  Administered 2018-07-09: 10 mg via INTRAVENOUS

## 2018-07-09 MED ORDER — SUGAMMADEX SODIUM 200 MG/2ML IV SOLN
INTRAVENOUS | Status: AC
  Filled 2018-07-09: qty 2

## 2018-07-09 MED ORDER — LIDOCAINE 2% (20 MG/ML) 5 ML SYRINGE
INTRAMUSCULAR | Status: DC | PRN
  Administered 2018-07-09: 80 mg via INTRAVENOUS

## 2018-07-09 MED ORDER — FENTANYL CITRATE (PF) 100 MCG/2ML IJ SOLN
INTRAMUSCULAR | Status: AC
  Filled 2018-07-09: qty 2

## 2018-07-09 MED ORDER — CEFAZOLIN SODIUM-DEXTROSE 2-4 GM/100ML-% IV SOLN
INTRAVENOUS | Status: AC
  Filled 2018-07-09: qty 100

## 2018-07-09 MED ORDER — KETOROLAC TROMETHAMINE 30 MG/ML IJ SOLN
INTRAMUSCULAR | Status: DC | PRN
  Administered 2018-07-09: 30 mg via INTRAVENOUS

## 2018-07-09 MED ORDER — OXYCODONE-ACETAMINOPHEN 7.5-325 MG PO TABS: 1.0000 | ORAL_TABLET | ORAL | 0 refills | Status: AC | PRN

## 2018-07-09 MED ORDER — DEXAMETHASONE SODIUM PHOSPHATE 10 MG/ML IJ SOLN
INTRAMUSCULAR | Status: AC
  Filled 2018-07-09: qty 1

## 2018-07-09 MED ORDER — ROCURONIUM BROMIDE 10 MG/ML (PF) SYRINGE
PREFILLED_SYRINGE | INTRAVENOUS | Status: DC | PRN
  Administered 2018-07-09: 50 mg via INTRAVENOUS

## 2018-07-09 MED ORDER — SODIUM CHLORIDE 0.9 % IR SOLN
Status: DC | PRN
  Administered 2018-07-09: 3000 mL

## 2018-07-09 MED ORDER — PROPOFOL 10 MG/ML IV BOLUS
INTRAVENOUS | Status: AC
  Filled 2018-07-09: qty 40

## 2018-07-09 MED ORDER — ACETAMINOPHEN 10 MG/ML IV SOLN
INTRAVENOUS | Status: AC
  Filled 2018-07-09: qty 100

## 2018-07-09 MED ORDER — SCOPOLAMINE 1 MG/3DAYS TD PT72
1.0000 | MEDICATED_PATCH | TRANSDERMAL | Status: DC
  Administered 2018-07-09: 1.5 mg via TRANSDERMAL
  Filled 2018-07-09: qty 1

## 2018-07-09 MED ORDER — PROMETHAZINE HCL 25 MG/ML IJ SOLN
6.2500 mg | INTRAMUSCULAR | Status: DC | PRN
  Filled 2018-07-09: qty 1

## 2018-07-09 MED ORDER — PROPOFOL 10 MG/ML IV BOLUS
INTRAVENOUS | Status: DC | PRN
  Administered 2018-07-09: 160 mg via INTRAVENOUS
  Administered 2018-07-09: 40 mg via INTRAVENOUS

## 2018-07-09 MED ORDER — KETOROLAC TROMETHAMINE 30 MG/ML IJ SOLN
INTRAMUSCULAR | Status: AC
  Filled 2018-07-09: qty 1

## 2018-07-09 MED ORDER — ONDANSETRON HCL 4 MG/2ML IJ SOLN
INTRAMUSCULAR | Status: AC
Start: 2018-07-09 — End: ?
  Filled 2018-07-09: qty 2

## 2018-07-09 MED ORDER — BUPIVACAINE HCL 0.25 % IJ SOLN
INTRAMUSCULAR | Status: DC | PRN
  Administered 2018-07-09: 5 mL

## 2018-07-09 MED ORDER — PROPOFOL 500 MG/50ML IV EMUL
INTRAVENOUS | Status: AC
  Filled 2018-07-09: qty 50

## 2018-07-09 MED ORDER — FENTANYL CITRATE (PF) 100 MCG/2ML IJ SOLN
INTRAMUSCULAR | Status: DC | PRN
  Administered 2018-07-09: 50 ug via INTRAVENOUS
  Administered 2018-07-09: 25 ug via INTRAVENOUS
  Administered 2018-07-09: 50 ug via INTRAVENOUS
  Administered 2018-07-09: 25 ug via INTRAVENOUS

## 2018-07-09 MED ORDER — SUGAMMADEX SODIUM 200 MG/2ML IV SOLN
INTRAVENOUS | Status: DC | PRN
  Administered 2018-07-09: 125 mg via INTRAVENOUS

## 2018-07-09 SURGICAL SUPPLY — 39 items
ADH SKN CLS APL DERMABOND .7 (GAUZE/BANDAGES/DRESSINGS) ×1
APPLICATOR COTTON TIP 6IN STRL (MISCELLANEOUS) IMPLANT
BAG RETRIEVAL 10MM (BASKET) ×2
CANISTER SUCT 3000ML PPV (MISCELLANEOUS) IMPLANT
CANISTER SUCTION 1200CC (MISCELLANEOUS) IMPLANT
CATH ROBINSON RED A/P 16FR (CATHETERS) ×3 IMPLANT
CHLORAPREP W/TINT 26ML (MISCELLANEOUS) ×2 IMPLANT
COVER MAYO STAND STRL (DRAPES) ×3 IMPLANT
DERMABOND ADVANCED (GAUZE/BANDAGES/DRESSINGS) ×2
DERMABOND ADVANCED .7 DNX12 (GAUZE/BANDAGES/DRESSINGS) ×1 IMPLANT
DRSG COVADERM PLUS 2X2 (GAUZE/BANDAGES/DRESSINGS) IMPLANT
DURAPREP 26ML APPLICATOR (WOUND CARE) ×1 IMPLANT
GLOVE BIO SURGEON STRL SZ7 (GLOVE) ×12 IMPLANT
GOWN STRL REUS W/TWL XL LVL3 (GOWN DISPOSABLE) ×3 IMPLANT
KIT TURNOVER CYSTO (KITS) ×3 IMPLANT
NEEDLE INSUFFLATION 120MM (ENDOMECHANICALS) IMPLANT
NS IRRIG 500ML POUR BTL (IV SOLUTION) ×3 IMPLANT
PACK LAPAROSCOPY BASIN (CUSTOM PROCEDURE TRAY) ×3 IMPLANT
PAD OB MATERNITY 4.3X12.25 (PERSONAL CARE ITEMS) ×3 IMPLANT
PAD PREP 24X48 CUFFED NSTRL (MISCELLANEOUS) ×3 IMPLANT
SCISSORS LAP 5X45 EPIX DISP (ENDOMECHANICALS) IMPLANT
SCRUB SOL TECHNICARE 32FOOTPED (MISCELLANEOUS) ×2 IMPLANT
SEALER TISSUE G2 CVD JAW 45CM (ENDOMECHANICALS) ×2 IMPLANT
SET IRRIG TUBING LAPAROSCOPIC (IRRIGATION / IRRIGATOR) ×2 IMPLANT
SET IRRIG Y TYPE TUR BLADDER L (SET/KITS/TRAYS/PACK) ×2 IMPLANT
SUT VIC AB 3-0 PS2 18 (SUTURE) ×3
SUT VIC AB 3-0 PS2 18XBRD (SUTURE) ×1 IMPLANT
SUT VICRYL 0 ENDOLOOP (SUTURE) IMPLANT
SUT VICRYL 0 UR6 27IN ABS (SUTURE) ×2 IMPLANT
SUT VICRYL 4-0 PS2 18IN ABS (SUTURE) ×2 IMPLANT
SYS BAG RETRIEVAL 10MM (BASKET) ×4
SYSTEM BAG RETRIEVAL 10MM (BASKET) IMPLANT
TOWEL OR 17X24 6PK STRL BLUE (TOWEL DISPOSABLE) ×6 IMPLANT
TROCAR BALLN 12MMX100 BLUNT (TROCAR) ×2 IMPLANT
TROCAR OPTI TIP 5M 100M (ENDOMECHANICALS) ×3 IMPLANT
TROCAR XCEL DIL TIP R 11M (ENDOMECHANICALS) ×2 IMPLANT
TUBING INSUF HEATED (TUBING) ×3 IMPLANT
WARMER LAPAROSCOPE (MISCELLANEOUS) ×3 IMPLANT
WATER STERILE IRR 500ML POUR (IV SOLUTION) ×3 IMPLANT

## 2018-07-09 NOTE — Op Note (Signed)
NAME: Rachel Fleming, Rachel Fleming MEDICAL RECORD [id number] ACCOUNT [id number] DATE OF BIRTH:October 12, 1956 FACILITY: [facility] LOCATION: [facility] PHYSICIAN:Leandria Thier Sherran Needs, MD  OPERATIVE REPORT  DATE OF PROCEDURE:  07/09/2018  PREOPERATIVE DIAGNOSIS:  Cystic enlargement of the left ovary.  POSTOPERATIVE DIAGNOSIS:  Cystic enlargement of the left ovary.  PROCEDURE:  Open laparoscopy with laparoscopic bilateral salpingo-oophorectomy.  Cystoscopy.  SURGEON:  Arvella Nigh, MD  ANESTHESIA:  General endotracheal.  ESTIMATED BLOOD LOSS:  Minimal.    PACKS:  None.  Packs:    DRAINS:  None.  INTRAOPERATIVE BLOOD PLACED:  None.  COMPLICATIONS:  None.  INDICATIONS:  Dictated in history and physical.  PROCEDURE:  The patient was taken to the OR and placed in supine position.  After satisfactory level of general endotracheal anesthesia was obtained, she was placed in dorsal lithotomy position using the Allen stirrups.  The abdomen and perineum were  prepped out with an antiseptic solution.  A Foley was placed to straight drain.  A sponge on a sponge stick was placed in the vaginal vault.  The patient was draped in sterile field.  Subumbilical incision made with a knife.  The fascia was identified  and entered sharply.  The peritoneum was entered with blunt finger pressure.  The open laparoscopic trocar was put in place and inflated.  Abdomen inflated with carbon dioxide.  Laparoscope was introduced.  She had some omental adhesions anteriorly.   There were no lower abdominal or pelvic adhesions.  A 5 mm trocar was put in place in suprapubic area.  On visualization, she had a normal appearing right ovary.  Left ovary had 2 cysts on it.  There was no ascites or any other peritoneal findings.  The  appendix was normal and both lateral gutters were clear.  At this point in time, the 5 mm trocar in the suprapubic area was removed.  The incision was expanded and a 10/11 blade trocar was put in place.  A 5 mm trocar  was put in place in the left lower  quadrant after we transilluminated the epigastric vessels.  At this point in time, we obtained pelvic washings and they were sent for cytology.  First, the right tube was elevated.  Ureters were easily identified.  Using the EnSeal, the right ovarian  vasculature was cauterized and incised and the peritoneal attachments were cauterized and incised.  The right ovary was placed in the cul-de-sac for later removal.  We then went to the left side.  Left tube and ovary were elevated.  We identified the  ureter along the left pelvic sidewall using the EnSeal.  The ovarian vasculature was cauterized and incised.  The peritoneal attachments were then cauterized and incised.  An Endobag was put in place.  The left tube and ovary were placed in that.  We  tried to move it through the suprapubic incision.  We had to extend the fascial incision slightly on that side.  We were able to retrieve the left tube and ovary.  This was sent for pathology.  Next, the Endobag was reinserted and the right tube and  ovary were put in place and removed through the suprapubic incision.  We now irrigated the pelvis thoroughly.  We looked at both areas where the ovaries have been removed.  They were hemostatically intact.  There was no active bleeding or signs of injury  to adjacent organs.  The abdomen was deflated of carbon dioxide.  All trocars were removed.  Subumbilical fascia closed with figure-of-eight 0 Vicryl.  Skin was closed with interrupted subcuticulars of 4-0 Vicryl and Dermabond.  The suprapubic incision  fascia was closed with a figure-of-eight of 0 Vicryl.  Skin was closed with interrupted subcuticulars of 4-0 Vicryl and Dermabond.  The left lower incision was closed with Dermabond.  At this point in time, the Foley was removed.  Cystoscope was introduced.  Visualization with no injury to the bladder.  Both ureteral orifices were visualized and noted to have jets of clear urine.  At  this point in time, we emptied the bladder.   Cystoscope was removed.  The patient was taken out of dorsal lithotomy position once alert and extubated and transferred to recovery room in good condition.  Sponge, instrument, and needle count was correct by circulating nurse multiple times.  TN/NUANCE  D:07/09/2018 T:07/09/2018 JOB:[id number]

## 2018-07-09 NOTE — Discharge Instructions (Signed)
DISCHARGE INSTRUCTIONS: Laparoscopy ° °The following instructions have been prepared to help you care for yourself upon your return home today. ° °Wound care: °• Do not get the incision wet for the first 24 hours. The incision should be kept clean and dry. °• The Band-Aids or dressings may be removed the day after surgery. °• Should the incision become sore, red, and swollen after the first week, check with your doctor. ° °Personal hygiene: °• Shower the day after your procedure. ° °Activity and limitations: °• Do NOT drive or operate any equipment today. °• Do NOT lift anything more than 15 pounds for 2-3 weeks after surgery. °• Do NOT rest in bed all day. °• Walking is encouraged. Walk each day, starting slowly with 5-minute walks 3 or 4 times a day. Slowly increase the length of your walks. °• Walk up and down stairs slowly. °• Do NOT do strenuous activities, such as golfing, playing tennis, bowling, running, biking, weight lifting, gardening, mowing, or vacuuming for 2-4 weeks. Ask your doctor when it is okay to start. ° °Diet: Eat a light meal as desired this evening. You may resume your usual diet tomorrow. ° °Return to work: This is dependent on the type of work you do. For the most part you can return to a desk job within a week of surgery. If you are more active at work, please discuss this with your doctor. ° °What to expect after your surgery: You may have a slight burning sensation when you urinate on the first day. You may have a very small amount of blood in the urine. Expect to have a small amount of vaginal discharge/light bleeding for 1-2 weeks. It is not unusual to have abdominal soreness and bruising for up to 2 weeks. You may be tired and need more rest for about 1 week. You may experience shoulder pain for 24-72 hours. Lying flat in bed may relieve it. ° °Call your doctor for any of the following: °• Develop a fever of 100.4 or greater °• Inability to urinate 6 hours after discharge from  hospital °• Severe pain not relieved by pain medications °• Persistent of heavy bleeding at incision site °• Redness or swelling around incision site after a week °• Increasing nausea or vomiting ° °Patient Signature________________________________________ °Nurse Signature_________________________________________ °Post Anesthesia Home Care Instructions ° °Activity: °Get plenty of rest for the remainder of the day. A responsible individual must stay with you for 24 hours following the procedure.  °For the next 24 hours, DO NOT: °-Drive a car °-Operate machinery °-Drink alcoholic beverages °-Take any medication unless instructed by your physician °-Make any legal decisions or sign important papers. ° °Meals: °Start with liquid foods such as gelatin or soup. Progress to regular foods as tolerated. Avoid greasy, spicy, heavy foods. If nausea and/or vomiting occur, drink only clear liquids until the nausea and/or vomiting subsides. Call your physician if vomiting continues. ° °Special Instructions/Symptoms: °Your throat may feel dry or sore from the anesthesia or the breathing tube placed in your throat during surgery. If this causes discomfort, gargle with warm salt water. The discomfort should disappear within 24 hours. ° °If you had a scopolamine patch placed behind your ear for the management of post- operative nausea and/or vomiting: ° °1. The medication in the patch is effective for 72 hours, after which it should be removed.  Wrap patch in a tissue and discard in the trash. Wash hands thoroughly with soap and water. °2. You may remove the patch   You may remove the patch earlier than 72 hours if you experience unpleasant side effects which may include dry mouth, dizziness or visual disturbances. 3. Avoid touching the patch. Wash your hands with soap and water after contact with the patch.    NO IBUPROFEN PRODUCTS (MOTRIN, ADVIL) OR ALEVE UNTIL 2:25PM TODAY.

## 2018-07-09 NOTE — Brief Op Note (Signed)
07/09/2018  8:51 AM  PATIENT:  Rachel Fleming  62 y.o. female  PRE-OPERATIVE DIAGNOSIS:  left ovarian cystic mass  POST-OPERATIVE DIAGNOSIS:  left ovarian cystic mass  PROCEDURE:  Procedure(s): LAPAROSCOPIC BILATERAL SALPINGO OOPHORECTOMY, cystocopy (Bilateral)  SURGEON:  Surgeon(s) and Role:    * Arvella Nigh, MD - Primary  PHYSICIAN ASSISTANT:   ASSISTANTS: none   ANESTHESIA:   local and general  EBL:  50 mL   BLOOD ADMINISTERED:none  DRAINS: none   LOCAL MEDICATIONS USED:  XYLOCAINE   SPECIMEN:  Source of Specimen:  bilateral tubes and ovaries  DISPOSITION OF SPECIMEN:  PATHOLOGY  COUNTS:  YES  TOURNIQUET:  * No tourniquets in log *  DICTATION: .Other Dictation: Dictation Number [id number]  PLAN OF CARE: Discharge to home after PACU  PATIENT DISPOSITION:  PACU - hemodynamically stable.   Delay start of Pharmacological VTE agent (>24hrs) due to surgical blood loss or risk of bleeding: not applicable

## 2018-07-09 NOTE — Anesthesia Preprocedure Evaluation (Signed)
Anesthesia Evaluation  Patient identified by MRN, date of birth, ID band Patient awake    Reviewed: Allergy & Precautions, NPO status , Patient's Chart, lab work & pertinent test results  History of Anesthesia Complications (+) PONV  Airway Mallampati: II  TM Distance: >3 FB Neck ROM: Full    Dental no notable dental hx.    Pulmonary neg pulmonary ROS, former smoker,    Pulmonary exam normal breath sounds clear to auscultation       Cardiovascular negative cardio ROS Normal cardiovascular exam Rhythm:Regular Rate:Normal     Neuro/Psych negative neurological ROS  negative psych ROS   GI/Hepatic negative GI ROS, Neg liver ROS,   Endo/Other  negative endocrine ROS  Renal/GU negative Renal ROS  negative genitourinary   Musculoskeletal negative musculoskeletal ROS (+) Arthritis ,   Abdominal   Peds negative pediatric ROS (+)  Hematology negative hematology ROS (+)   Anesthesia Other Findings   Reproductive/Obstetrics negative OB ROS                             Lab Results  Component Value Date   WBC 6.1 07/03/2018   HGB 13.4 07/03/2018   HCT 40.6 07/03/2018   MCV 94.6 07/03/2018   PLT 356 07/03/2018     Anesthesia Physical  Anesthesia Plan  ASA: II  Anesthesia Plan: General   Post-op Pain Management:    Induction: Intravenous  PONV Risk Score and Plan: 4 or greater and Treatment may vary due to age or medical condition, Ondansetron, Dexamethasone, Midazolam and Scopolamine patch - Pre-op  Airway Management Planned: Oral ETT  Additional Equipment:   Intra-op Plan:   Post-operative Plan: Extubation in OR  Informed Consent: I have reviewed the patients History and Physical, chart, labs and discussed the procedure including the risks, benefits and alternatives for the proposed anesthesia with the patient or authorized representative who has indicated his/her understanding  and acceptance.     Plan Discussed with: CRNA  Anesthesia Plan Comments:         Anesthesia Quick Evaluation

## 2018-07-09 NOTE — H&P (Signed)
  History and physical exam unchanged 

## 2018-07-09 NOTE — Anesthesia Postprocedure Evaluation (Signed)
Anesthesia Post Note  Patient: DARLIN STENSETH  Procedure(s) Performed: LAPAROSCOPIC BILATERAL SALPINGO OOPHORECTOMY, cystocopy (Bilateral )     Patient location during evaluation: PACU Anesthesia Type: General Level of consciousness: awake and alert Pain management: pain level controlled Vital Signs Assessment: post-procedure vital signs reviewed and stable Respiratory status: spontaneous breathing, nonlabored ventilation and respiratory function stable Cardiovascular status: blood pressure returned to baseline and stable Postop Assessment: no apparent nausea or vomiting Anesthetic complications: no    Last Vitals:  Vitals:   07/09/18 1000 07/09/18 1030  BP: 124/76   Pulse: 72 63  Resp: 13 16  Temp:  36.4 C  SpO2: 100% 97%    Last Pain:  Vitals:   07/09/18 1030  TempSrc:   PainSc: 0-No pain                 Lynda Rainwater

## 2018-07-09 NOTE — Anesthesia Procedure Notes (Signed)
Procedure Name: Intubation Date/Time: 07/09/2018 7:31 AM Performed by: Lynda Rainwater, MD Pre-anesthesia Checklist: Patient identified, Emergency Drugs available, Suction available and Patient being monitored Patient Re-evaluated:Patient Re-evaluated prior to induction Oxygen Delivery Method: Circle system utilized Preoxygenation: Pre-oxygenation with 100% oxygen Induction Type: IV induction Ventilation: Mask ventilation without difficulty Laryngoscope Size: Mac and 3 Grade View: Grade I Tube type: Oral Tube size: 7.0 mm Number of attempts: 1 Airway Equipment and Method: Stylet and Oral airway Placement Confirmation: ETT inserted through vocal cords under direct vision,  positive ETCO2 and breath sounds checked- equal and bilateral Secured at: 21 cm Tube secured with: Tape Dental Injury: Teeth and Oropharynx as per pre-operative assessment

## 2018-07-09 NOTE — Transfer of Care (Signed)
  Last Vitals:  Vitals Value Taken Time  BP 129/71 07/09/2018  9:05 AM  Temp    Pulse 83 07/09/2018  9:09 AM  Resp    SpO2 100 % 07/09/2018  9:09 AM  Vitals shown include unvalidated device data.  Last Pain:  Vitals:   07/09/18 0532  TempSrc: Oral         Immediate Anesthesia Transfer of Care Note  Patient: Rachel Fleming  Procedure(s) Performed: Procedure(s) (LRB): LAPAROSCOPIC BILATERAL SALPINGO OOPHORECTOMY, cystocopy (Bilateral)  Patient Location: PACU  Anesthesia Type: General  Level of Consciousness: awake, alert  and oriented  Airway & Oxygen Therapy: Patient Spontanous Breathing and Patient connected to nasal cannula oxygen  Post-op Assessment: Report given to PACU RN and Post -op Vital signs reviewed and stable  Post vital signs: Reviewed and stable  Complications: No apparent anesthesia complications

## 2018-07-10 ENCOUNTER — Encounter (HOSPITAL_BASED_OUTPATIENT_CLINIC_OR_DEPARTMENT_OTHER): Payer: Self-pay | Admitting: Obstetrics and Gynecology

## 2018-07-31 IMAGING — CT CT HEART SCORING
2 series · 16 of 20 positions shown, 18 images · non-contrast
Comparison: None.

CLINICAL DATA: Risk stratification

EXAM:
Coronary Calcium Score
TECHNIQUE: The patient was scanned on a Siemens Force scanner. Axial
non-contrast 3 mm slices were carried out through the heart. The
data set was analyzed on a dedicated work station and scored using
the Agatson method.

[Series 2: casc 3.0 i36f 2 bestdiast 69 % · axial · 0.31mm/px · z∈[-282,-165]mm · 8 of 51 slices shown, 10 images]
[im 6/51  vessel]
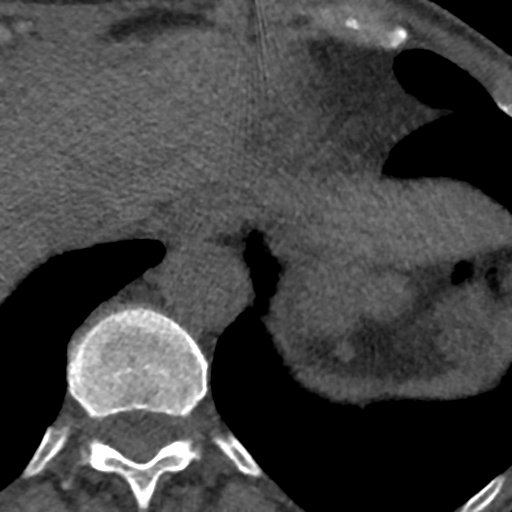
[im 6/51  lung]
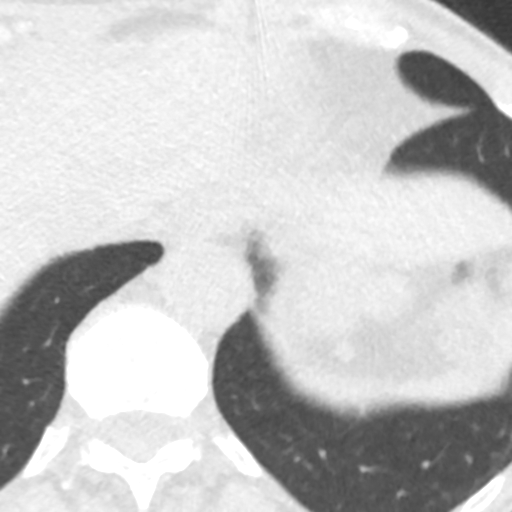
[im 12/51  vessel]
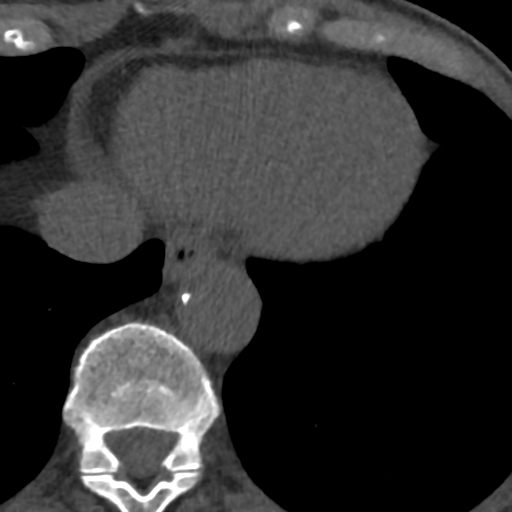
[im 17/51  vessel]
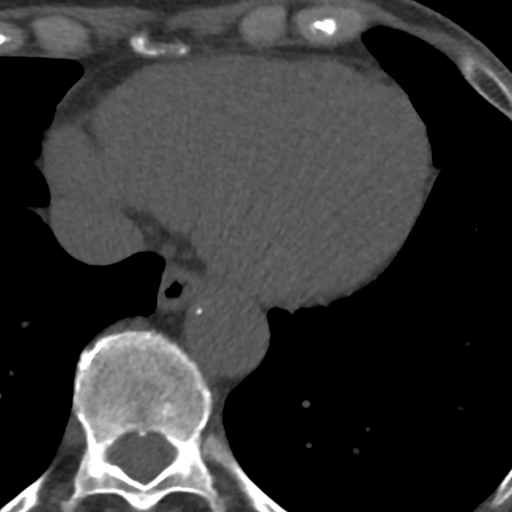
[im 23/51  vessel]
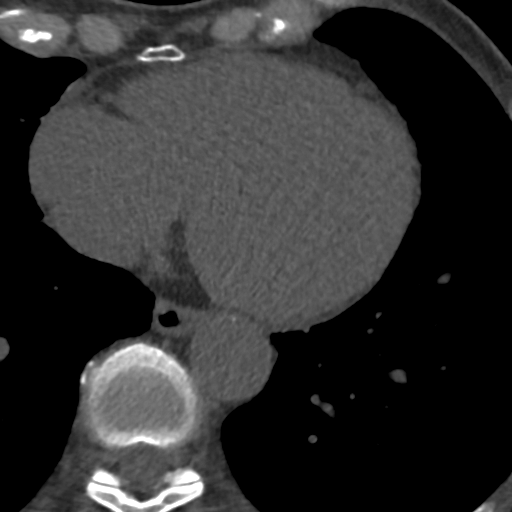
[im 28/51  vessel]
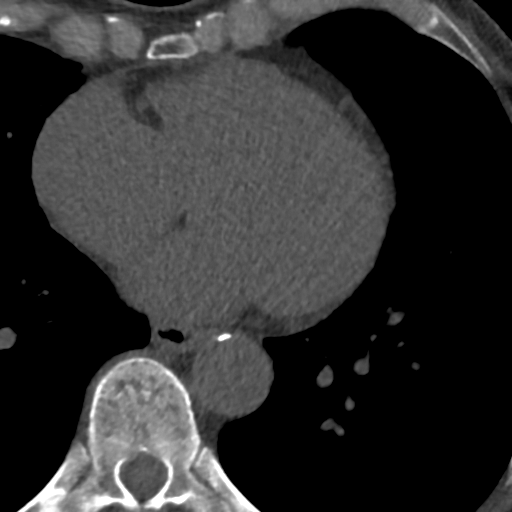
[im 28/51  lung]
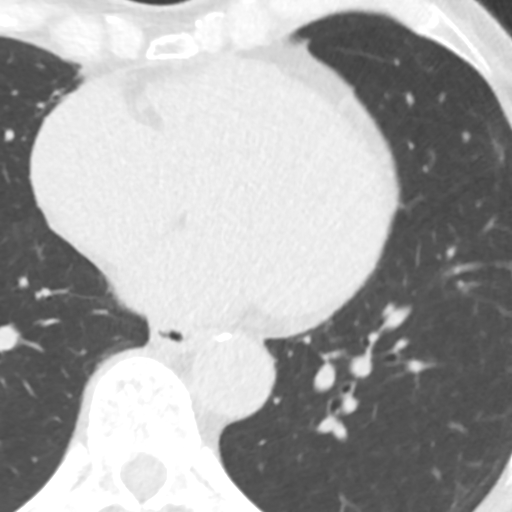
[im 34/51  vessel]
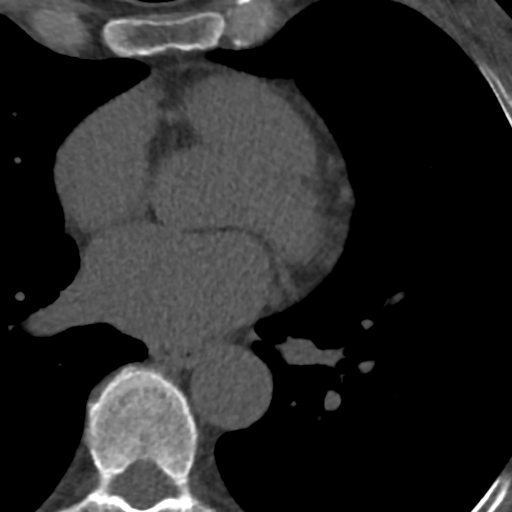
[im 39/51  vessel]
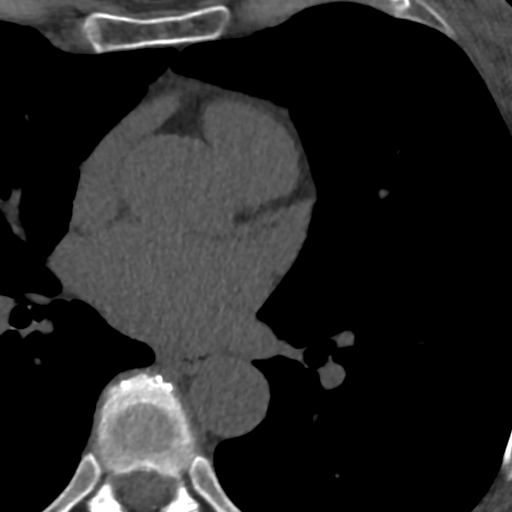
[im 45/51  vessel]
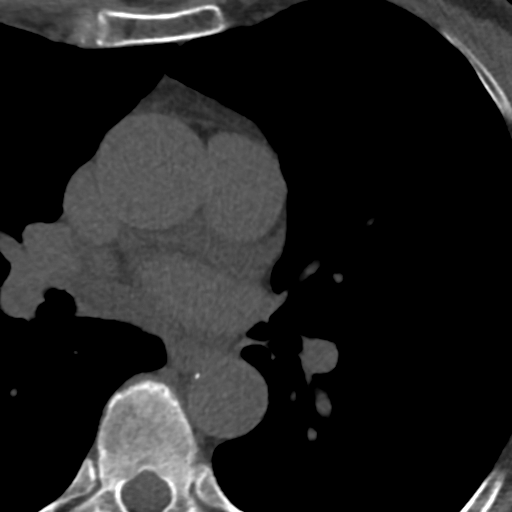

[Series 4: lung st 72 % · axial · 0.64mm/px · z∈[-284,-164]mm · 8 of 52 slices shown]
[im 6/52  lung]
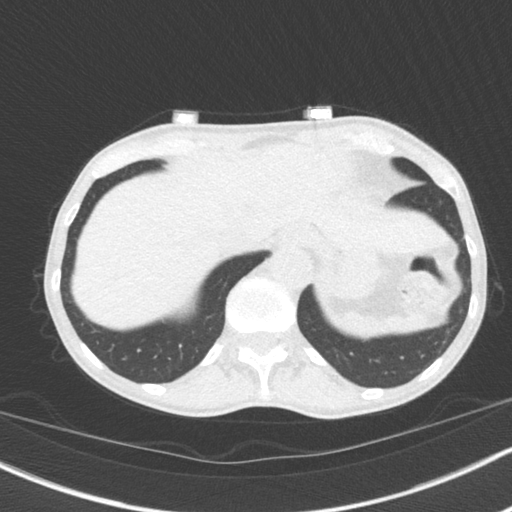
[im 12/52  lung]
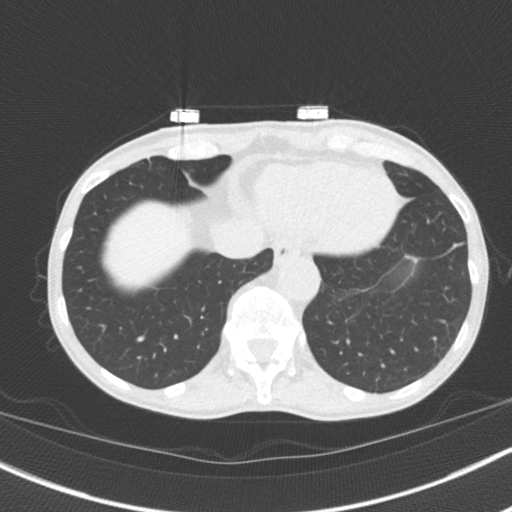
[im 18/52  lung]
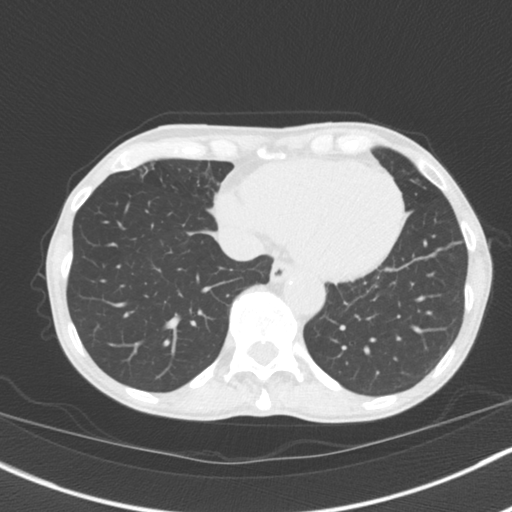
[im 23/52  lung]
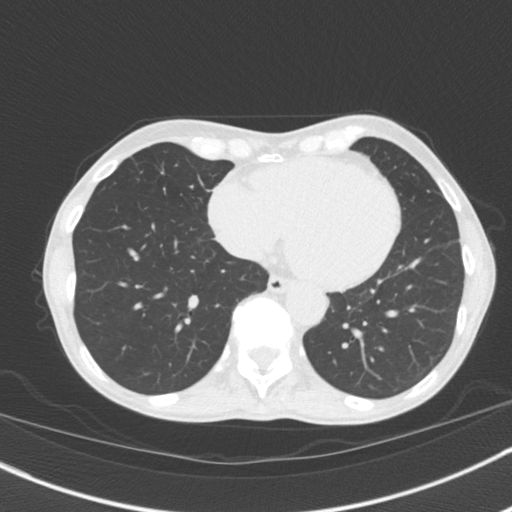
[im 29/52  lung]
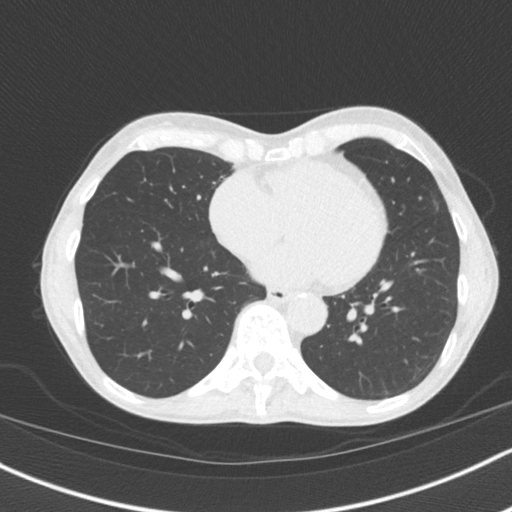
[im 35/52  lung]
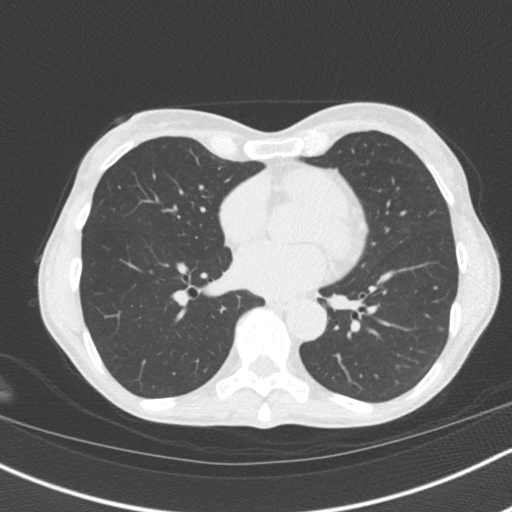
[im 40/52  lung]
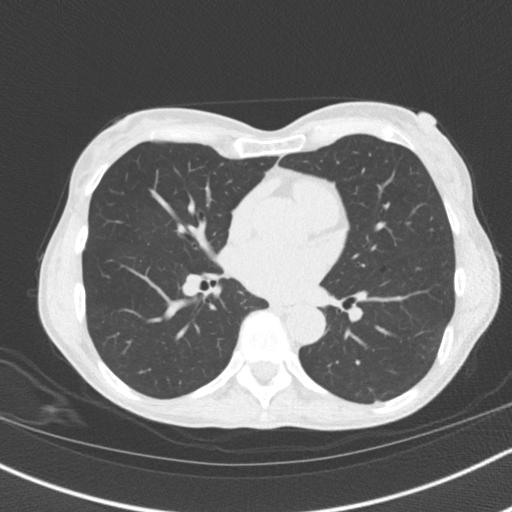
[im 46/52  lung]
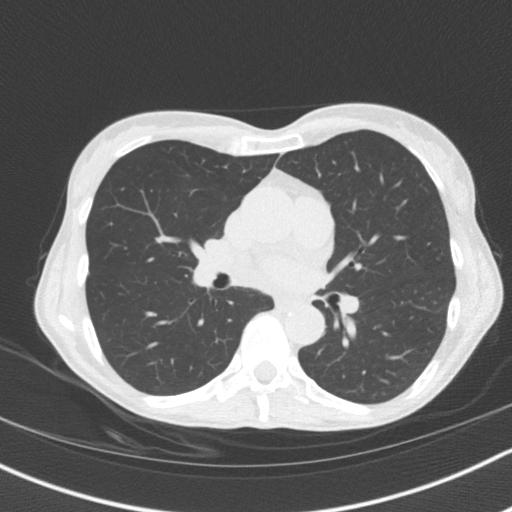

[16 of 20 positions shown; findings below may reference images not displayed]

FINDINGS: Non-cardiac: See separate report from [REDACTED].

Ascending Aorta: Normal size, minimal focal calcium in the aortic
root.

Pericardium: Normal.

Coronary arteries: Normal origin.
IMPRESSION: Coronary calcium score of 0. This was 0 percentile for age and sex
matched control.

EXAM:
OVER-READ INTERPRETATION  CT CHEST

The following report is an over-read performed by radiologist Dr.
Speedy Mandujano [REDACTED] on 06/11/2018. This
over-read does not include interpretation of cardiac or coronary
anatomy or pathology. The calcium score interpretation by the
cardiologist is attached.
FINDINGS: Limited view of the lung parenchyma demonstrates no suspicious
nodularity. Airways are normal.

Limited view of the mediastinum demonstrates no adenopathy.
Esophagus normal.

Limited view of the upper abdomen unremarkable.

Limited view of the skeleton and chest wall is unremarkable.
IMPRESSION: No significant extracardiac findings

## 2018-08-03 NOTE — Patient Instructions (Addendum)
Your procedure is scheduled on: Thursday, 9/12  Enter through the Main Entrance of [facility] at: 6 am  Pick up the phone at the desk and dial [phone].  Call this number if you have problems the morning of surgery: [phone].  Remember: Do NOT eat food or Do NOT drink clear liquids (including water) after midnight Wednesday.  Take these medicines the morning of surgery with a SIP OF WATER: None.  Ok to use eye drops if needed.  Stop herbal medications, vitamin supplements, Ibuprofen/NSAIDS at this time.  Do NOT wear jewelry (body piercing), metal hair clips/bobby pins, make-up, or nail polish. Do NOT wear lotions, powders, or perfumes.  You may wear deoderant. Do NOT shave for 48 hours prior to surgery. Do NOT bring valuables to the hospital.  Have a responsible adult drive you home and stay with you for 24 hours after your procedure.  Home with Mother Rachel Fleming cell [phone].

## 2018-08-04 NOTE — H&P (Signed)
NAME: Rachel Fleming, Rachel Fleming MEDICAL RECORD [id number] ACCOUNT [id number] DATE OF BIRTH:1956/02/21 FACILITY: [facility] LOCATION:  PHYSICIAN:Krishna Heuer Sherran Needs, MD  HISTORY AND PHYSICAL  DATE OF ADMISSION:  08/06/2018  Date of surgery is  08/06/2018 at [facility] here in [place].  HISTORY OF PRESENT ILLNESS:  The patient is a 62 year old gravida 1, para 1, postmenopausal patient presents for repair of a suprapubic hernia.  The patient on August 15th of this year underwent laparoscopic bilateral salpingo-oophorectomy.  This was  done for persistent cystic enlargement of the ovary.  All pathology came back benign.  Postop, she developed a swelling in the suprapubic area we thought was a hematoma.  We gave it time to resolve and failed to do so.  We did a CT scan and she has this  hernia in the lower incision.  She now presents for repair.  ALLERGIES:  She is allergic to BETADINE.  MEDICATIONS:  She is on Aleve and Instaflex.  PAST MEDICAL HISTORY:  She had a hysterectomy in 2004.  She has had recent bilateral salpingo-oophorectomy.  She has got a history of arthritis and other medical conditions.  SOCIAL HISTORY:  No tobacco or alcohol use.  FAMILY HISTORY:  Noncontributory.  REVIEW OF SYSTEMS:  Noncontributory.  PHYSICAL EXAMINATION: VITAL SIGNS:  Afebrile, stable vital signs. HEENT:  Patient is normocephalic.  Pupils equal, round, react to light and accommodation.  Extraocular movements are intact.  Sclerae and conjunctivae are clear.  Oropharynx clear. NECK:  Without thyromegaly. BREASTS:  Not examined. LUNGS:  Clear. HEART:  Regular rate and rhythm.  No murmurs or gallops.  No carotid or abdominal bruits. ABDOMEN:  On  abdominal exam, she has a suprapubic swelling consistent with hernia.  Subumbilical incision is intact. PELVIC:  Deferred. EXTREMITIES:  Trace edema. NEUROLOGIC:  Grossly normal limits.  IMPRESSION:  Suprapubic incisional hernia.  PLAN:  The patient to undergo  surgical repair.  Risks of surgery have been discussed including the risk of infection.  The risk of hemorrhage that could require transfusion with the risk of AIDS or hepatitis.  Risk of injury to adjacent organs such as  bladder or bowel that could require further exploratory surgery.  Risk of deep venous thrombosis and pulmonary embolus.  Potential risk of recurrent hernias have been explained.  The patient expressed understanding of indications and risks.  AN/NUANCE  D:08/04/2018 T:08/04/2018 JOB:[id number]

## 2018-08-04 NOTE — H&P (Signed)
Patient name Rachel Fleming, [place] DICTATION# [id number] CSN# [id number]  Arvella Nigh, MD 08/04/2018 5:44 AM

## 2018-08-05 ENCOUNTER — Other Ambulatory Visit: Payer: Self-pay

## 2018-08-05 ENCOUNTER — Encounter (HOSPITAL_COMMUNITY): Payer: Self-pay

## 2018-08-05 ENCOUNTER — Encounter (HOSPITAL_COMMUNITY)
Admission: RE | Admit: 2018-08-05 | Discharge: 2018-08-05 | Disposition: A | Discharge status: Home / Self Care | Payer: Commercial Managed Care - PPO | Source: Ambulatory Visit | Attending: Obstetrics and Gynecology | Admitting: Obstetrics and Gynecology

## 2018-08-05 DIAGNOSIS — K432 Incisional hernia without obstruction or gangrene: Secondary | ICD-10-CM | POA: Diagnosis not present

## 2018-08-05 DIAGNOSIS — Z87891 Personal history of nicotine dependence: Secondary | ICD-10-CM | POA: Diagnosis not present

## 2018-08-05 DIAGNOSIS — Z90710 Acquired absence of both cervix and uterus: Secondary | ICD-10-CM | POA: Diagnosis not present

## 2018-08-05 DIAGNOSIS — Z79899 Other long term (current) drug therapy: Secondary | ICD-10-CM | POA: Diagnosis not present

## 2018-08-05 DIAGNOSIS — Z888 Allergy status to other drugs, medicaments and biological substances status: Secondary | ICD-10-CM | POA: Diagnosis not present

## 2018-08-05 LAB — CBC
HEMATOCRIT: 40.2 % (ref 36.0–46.0)
HEMOGLOBIN: 13.3 g/dL (ref 12.0–15.0)
MCH: 31.5 pg (ref 26.0–34.0)
MCHC: 33.1 g/dL (ref 30.0–36.0)
MCV: 95.3 fL (ref 78.0–100.0)
PLATELETS: 320 10*3/uL (ref 150–400)
RBC: 4.22 MIL/uL (ref 3.87–5.11)
RDW: 13.4 % (ref 11.5–15.5)
WBC: 6 10*3/uL (ref 4.0–10.5)

## 2018-08-06 ENCOUNTER — Encounter (HOSPITAL_COMMUNITY): Payer: Self-pay

## 2018-08-06 ENCOUNTER — Encounter (HOSPITAL_COMMUNITY)
Admission: RE | Disposition: A | Discharge status: Home / Self Care | Payer: Self-pay | Source: Ambulatory Visit | Attending: Obstetrics and Gynecology

## 2018-08-06 ENCOUNTER — Ambulatory Visit (HOSPITAL_COMMUNITY): Payer: Commercial Managed Care - PPO | Admitting: Anesthesiology

## 2018-08-06 ENCOUNTER — Ambulatory Visit (HOSPITAL_COMMUNITY)
Admission: RE | Admit: 2018-08-06 | Discharge: 2018-08-06 | Disposition: A | Discharge status: Home / Self Care | Payer: Commercial Managed Care - PPO | Source: Ambulatory Visit | Attending: Obstetrics and Gynecology | Admitting: Obstetrics and Gynecology

## 2018-08-06 DIAGNOSIS — Z888 Allergy status to other drugs, medicaments and biological substances status: Secondary | ICD-10-CM | POA: Insufficient documentation

## 2018-08-06 DIAGNOSIS — Z90710 Acquired absence of both cervix and uterus: Secondary | ICD-10-CM | POA: Insufficient documentation

## 2018-08-06 DIAGNOSIS — Z87891 Personal history of nicotine dependence: Secondary | ICD-10-CM | POA: Insufficient documentation

## 2018-08-06 DIAGNOSIS — Z79899 Other long term (current) drug therapy: Secondary | ICD-10-CM | POA: Insufficient documentation

## 2018-08-06 DIAGNOSIS — K432 Incisional hernia without obstruction or gangrene: Secondary | ICD-10-CM | POA: Diagnosis not present

## 2018-08-06 HISTORY — PX: TUBAL LIGATION: SHX77

## 2018-08-06 HISTORY — PX: INCISIONAL HERNIA REPAIR: SHX193

## 2018-08-06 SURGERY — LIGATION, FALLOPIAN TUBE, POSTPARTUM
Anesthesia: General

## 2018-08-06 MED ORDER — CEFAZOLIN SODIUM-DEXTROSE 2-4 GM/100ML-% IV SOLN
2.0000 g | INTRAVENOUS | Status: AC
  Administered 2018-08-06: 2 g via INTRAVENOUS

## 2018-08-06 MED ORDER — LIDOCAINE HCL (CARDIAC) PF 100 MG/5ML IV SOSY
PREFILLED_SYRINGE | INTRAVENOUS | Status: DC | PRN
  Administered 2018-08-06: 80 mg via INTRAVENOUS

## 2018-08-06 MED ORDER — DEXAMETHASONE SODIUM PHOSPHATE 4 MG/ML IJ SOLN
INTRAMUSCULAR | Status: AC
  Filled 2018-08-06: qty 1

## 2018-08-06 MED ORDER — ONDANSETRON HCL 4 MG/2ML IJ SOLN
INTRAMUSCULAR | Status: DC | PRN
  Administered 2018-08-06: 4 mg via INTRAVENOUS

## 2018-08-06 MED ORDER — ACETAMINOPHEN 10 MG/ML IV SOLN
INTRAVENOUS | Status: DC | PRN
  Administered 2018-08-06: 1000 mg via INTRAVENOUS

## 2018-08-06 MED ORDER — ONDANSETRON HCL 4 MG/2ML IJ SOLN: 4.0000 mg | Freq: Once | INTRAMUSCULAR | Status: DC | PRN

## 2018-08-06 MED ORDER — DEXAMETHASONE SODIUM PHOSPHATE 10 MG/ML IJ SOLN
INTRAMUSCULAR | Status: DC | PRN
  Administered 2018-08-06: 10 mg via INTRAVENOUS

## 2018-08-06 MED ORDER — PHENYLEPHRINE HCL 10 MG/ML IJ SOLN
INTRAMUSCULAR | Status: DC | PRN
  Administered 2018-08-06 (×2): 80 ug via INTRAVENOUS

## 2018-08-06 MED ORDER — KETOROLAC TROMETHAMINE 30 MG/ML IJ SOLN
INTRAMUSCULAR | Status: AC
  Filled 2018-08-06: qty 1

## 2018-08-06 MED ORDER — OXYCODONE HCL 5 MG/5ML PO SOLN: 5.0000 mg | Freq: Once | ORAL | Status: DC | PRN

## 2018-08-06 MED ORDER — SCOPOLAMINE 1 MG/3DAYS TD PT72
MEDICATED_PATCH | TRANSDERMAL | Status: AC
  Administered 2018-08-06: 1.5 mg via TRANSDERMAL
  Filled 2018-08-06: qty 1

## 2018-08-06 MED ORDER — PROPOFOL 10 MG/ML IV BOLUS
INTRAVENOUS | Status: DC | PRN
  Administered 2018-08-06: 170 mg via INTRAVENOUS
  Administered 2018-08-06: 50 mg via INTRAVENOUS

## 2018-08-06 MED ORDER — OXYCODONE HCL 5 MG PO TABS: 5.0000 mg | ORAL_TABLET | Freq: Once | ORAL | Status: DC | PRN

## 2018-08-06 MED ORDER — BUPIVACAINE HCL (PF) 0.25 % IJ SOLN
INTRAMUSCULAR | Status: AC
  Filled 2018-08-06: qty 30

## 2018-08-06 MED ORDER — LACTATED RINGERS IV SOLN
INTRAVENOUS | Status: DC
  Administered 2018-08-06: 09:00:00 via INTRAVENOUS
  Administered 2018-08-06: 125 mL/h via INTRAVENOUS

## 2018-08-06 MED ORDER — MIDAZOLAM HCL 2 MG/2ML IJ SOLN
INTRAMUSCULAR | Status: DC | PRN
  Administered 2018-08-06 (×2): 1 mg via INTRAVENOUS

## 2018-08-06 MED ORDER — PHENYLEPHRINE 40 MCG/ML (10ML) SYRINGE FOR IV PUSH (FOR BLOOD PRESSURE SUPPORT)
PREFILLED_SYRINGE | INTRAVENOUS | Status: AC
  Filled 2018-08-06: qty 10

## 2018-08-06 MED ORDER — FENTANYL CITRATE (PF) 100 MCG/2ML IJ SOLN
INTRAMUSCULAR | Status: DC | PRN
  Administered 2018-08-06 (×3): 50 ug via INTRAVENOUS

## 2018-08-06 MED ORDER — FENTANYL CITRATE (PF) 100 MCG/2ML IJ SOLN
INTRAMUSCULAR | Status: AC
  Filled 2018-08-06: qty 2

## 2018-08-06 MED ORDER — KETOROLAC TROMETHAMINE 30 MG/ML IJ SOLN
30.0000 mg | Freq: Once | INTRAMUSCULAR | Status: AC | PRN
  Administered 2018-08-06: 30 mg via INTRAVENOUS

## 2018-08-06 MED ORDER — FENTANYL CITRATE (PF) 100 MCG/2ML IJ SOLN
25.0000 ug | INTRAMUSCULAR | Status: DC | PRN
  Administered 2018-08-06: 25 ug via INTRAVENOUS

## 2018-08-06 MED ORDER — PROPOFOL 10 MG/ML IV BOLUS
INTRAVENOUS | Status: AC
  Filled 2018-08-06: qty 20

## 2018-08-06 MED ORDER — MIDAZOLAM HCL 2 MG/2ML IJ SOLN
INTRAMUSCULAR | Status: AC
  Filled 2018-08-06: qty 2

## 2018-08-06 MED ORDER — METOCLOPRAMIDE HCL 5 MG/ML IJ SOLN
INTRAMUSCULAR | Status: DC | PRN
  Administered 2018-08-06: 10 mg via INTRAVENOUS

## 2018-08-06 MED ORDER — MEPERIDINE HCL 25 MG/ML IJ SOLN: 6.2500 mg | INTRAMUSCULAR | Status: DC | PRN

## 2018-08-06 MED ORDER — FENTANYL CITRATE (PF) 250 MCG/5ML IJ SOLN
INTRAMUSCULAR | Status: AC
  Filled 2018-08-06: qty 5

## 2018-08-06 MED ORDER — LIDOCAINE HCL (CARDIAC) PF 100 MG/5ML IV SOSY
PREFILLED_SYRINGE | INTRAVENOUS | Status: AC
  Filled 2018-08-06: qty 5

## 2018-08-06 MED ORDER — METOCLOPRAMIDE HCL 5 MG/ML IJ SOLN
INTRAMUSCULAR | Status: AC
  Filled 2018-08-06: qty 2

## 2018-08-06 MED ORDER — ONDANSETRON HCL 4 MG/2ML IJ SOLN
INTRAMUSCULAR | Status: AC
  Filled 2018-08-06: qty 2

## 2018-08-06 MED ORDER — SCOPOLAMINE 1 MG/3DAYS TD PT72
1.0000 | MEDICATED_PATCH | TRANSDERMAL | Status: DC
  Administered 2018-08-06: 1.5 mg via TRANSDERMAL

## 2018-08-06 MED ORDER — ACETAMINOPHEN 325 MG PO TABS: 325.0000 mg | ORAL_TABLET | ORAL | Status: DC | PRN

## 2018-08-06 MED ORDER — ACETAMINOPHEN 10 MG/ML IV SOLN
INTRAVENOUS | Status: AC
  Filled 2018-08-06: qty 100

## 2018-08-06 MED ORDER — PROPOFOL 10 MG/ML IV BOLUS
INTRAVENOUS | Status: AC
  Filled 2018-08-06: qty 40

## 2018-08-06 MED ORDER — PROPOFOL 500 MG/50ML IV EMUL
INTRAVENOUS | Status: DC | PRN
  Administered 2018-08-06: 100 ug/kg/min via INTRAVENOUS

## 2018-08-06 MED ORDER — ACETAMINOPHEN 160 MG/5ML PO SOLN: 325.0000 mg | ORAL | Status: DC | PRN

## 2018-08-06 SURGICAL SUPPLY — 23 items
CANISTER SUCT 3000ML (MISCELLANEOUS) ×2 IMPLANT
CLOSURE WOUND 1/2 X4 (GAUZE/BANDAGES/DRESSINGS) ×1
DRSG OPSITE POSTOP 3X4 (GAUZE/BANDAGES/DRESSINGS) ×3 IMPLANT
ELECT REM PT RETURN 9FT ADLT (ELECTROSURGICAL) ×3
ELECTRODE REM PT RTRN 9FT ADLT (ELECTROSURGICAL) ×1 IMPLANT
GLOVE BIO SURGEON STRL SZ7 (GLOVE) ×3 IMPLANT
GLOVE BIOGEL PI IND STRL 7.0 (GLOVE) ×1 IMPLANT
GLOVE BIOGEL PI INDICATOR 7.0 (GLOVE) ×2
GOWN STRL REUS W/TWL LRG LVL3 (GOWN DISPOSABLE) ×6 IMPLANT
NEEDLE HYPO 22GX1.5 SAFETY (NEEDLE) ×2 IMPLANT
NS IRRIG 1000ML POUR BTL (IV SOLUTION) ×3 IMPLANT
PACK ABDOMINAL MINOR (CUSTOM PROCEDURE TRAY) ×3 IMPLANT
PENCIL BUTTON HOLSTER BLD 10FT (ELECTRODE) ×3 IMPLANT
PROTECTOR NERVE ULNAR (MISCELLANEOUS) ×3 IMPLANT
SPONGE LAP 4X18 RFD (DISPOSABLE) ×2 IMPLANT
STRIP CLOSURE SKIN 1/2X4 (GAUZE/BANDAGES/DRESSINGS) ×1 IMPLANT
SUT PROLENE 2 0 CT2 30 (SUTURE) ×4 IMPLANT
SUT VIC AB 4-0 PS2 27 (SUTURE) ×5 IMPLANT
SYR CONTROL 10ML LL (SYRINGE) ×2 IMPLANT
TOWEL OR 17X24 6PK STRL BLUE (TOWEL DISPOSABLE) ×6 IMPLANT
TUBING CONNECTING 10 (TUBING) ×1 IMPLANT
TUBING CONNECTING 10' (TUBING) ×1
YANKAUER SUCT BULB TIP NO VENT (SUCTIONS) ×2 IMPLANT

## 2018-08-06 NOTE — Anesthesia Procedure Notes (Signed)
Procedure Name: LMA Insertion Date/Time: 08/06/2018 7:34 AM Performed by: Asher Muir, CRNA Pre-anesthesia Checklist: Patient being monitored, Patient identified, Emergency Drugs available and Suction available Patient Re-evaluated:Patient Re-evaluated prior to induction Oxygen Delivery Method: Circle system utilized Preoxygenation: Pre-oxygenation with 100% oxygen Induction Type: IV induction and Inhalational induction Ventilation: Mask ventilation without difficulty LMA: LMA inserted LMA Size: 4.0 Number of attempts: 1 Dental Injury: Teeth and Oropharynx as per pre-operative assessment

## 2018-08-06 NOTE — Anesthesia Postprocedure Evaluation (Signed)
Anesthesia Post Note  Patient: Rachel Fleming  Procedure(s) Performed: REPAIR OF INCISIONAL HERNIA (N/A )     Patient location during evaluation: PACU Anesthesia Type: General Level of consciousness: awake Pain management: pain level controlled Vital Signs Assessment: post-procedure vital signs reviewed and stable Respiratory status: spontaneous breathing Cardiovascular status: stable Postop Assessment: no apparent nausea or vomiting Anesthetic complications: no    Last Vitals:  Vitals:   08/06/18 0930 08/06/18 0936  BP:  127/75  Pulse: 62 80  Resp: 16 16  Temp:  36.4 C  SpO2: 100% 99%    Last Pain:  Vitals:   08/06/18 0936  TempSrc:   PainSc: 2    Pain Goal: Patients Stated Pain Goal: 5 (08/06/18 0600)               Cashae Weich JR,JOHN Mateo Flow

## 2018-08-06 NOTE — Transfer of Care (Signed)
Immediate Anesthesia Transfer of Care Note  Patient: Rachel Fleming  Procedure(s) Performed: REPAIR OF INCISIONAL HERNIA (N/A )  Patient Location: PACU  Anesthesia Type:General  Level of Consciousness: sedated  Airway & Oxygen Therapy: Patient Spontanous Breathing and Patient connected to nasal cannula oxygen  Post-op Assessment: Report given to RN  Post vital signs: Reviewed and stable  Last Vitals:  Vitals Value Taken Time  BP    Temp    Pulse 81 08/06/2018  8:34 AM  Resp    SpO2 100 % 08/06/2018  8:34 AM  Vitals shown include unvalidated device data.  Last Pain:  Vitals:   08/06/18 0600  TempSrc: Oral  PainSc: 0-No pain      Patients Stated Pain Goal: 5 (53/00/51 1021)  Complications: No apparent anesthesia complications

## 2018-08-06 NOTE — H&P (Signed)
  History and physical exam unchanged 

## 2018-08-06 NOTE — Op Note (Signed)
NAME: Rachel Fleming, GAL MEDICAL RECORD [id number] ACCOUNT [id number] DATE OF BIRTH:11-01-1956 FACILITY: [place] [facility] LOCATION: [facility] PHYSICIAN:Sherial Ebrahim Sherran Needs, MD  OPERATIVE REPORT  DATE OF PROCEDURE:  08/06/2018  PREOPERATIVE DIAGNOSIS:  Incisional hernia.  POSTOPERATIVE DIAGNOSIS:  Incisional hernia.  PROCEDURE:  Repair of incisional hernia.  SURGEON:  Arvella Nigh, MD  ANESTHESIA:  General.  ESTIMATED BLOOD LOSS:  Minimal.  PACKS AND DRAINS:  None.  INTRAOPERATIVE BLOOD PLACED:  None.  COMPLICATIONS:  None.  INDICATIONS:  As previously dictated.  DESCRIPTION OF PROCEDURE:  The patient was taken to the OR and placed in supine position.  After satisfactory level of general anesthesia was obtained, the bladder was emptied by catheterization.  The abdomen was prepped with DuraPrep. The suprapubic  incision was identified.  The skin was excised.  We then dissected down inferiorly.  We identified the fascia on each side.  We separated that.  We could identify the hole in the fascia with a little bit of omentum sticking out through the hole in  between the muscles.  We were able to push the omentum back into the abdominal cavity.  We used 2-0 Prolene and first brought the muscles together over the defect with interrupted sutures.  We did this in several layers.  We then brought the fascia  together with interrupted sutures of 2-0 Prolene.  Skin was closed with running subcuticular of 4-0 Vicryl.  Steri-Strips were applied.  Sponge, instrument, and needle count was correct by the circulating nurse.  The patient tolerated the procedure well.   Once extubated, she was transferred to recovery room in good condition.  TN/NUANCE  D:08/06/2018 T:08/06/2018 JOB:[id number]

## 2018-08-06 NOTE — Discharge Instructions (Signed)
Laparoscopic Ventral Hernia Repair, Care After This sheet gives you information about how to care for yourself after your procedure. Your health care provider may also give you more specific instructions. If you have problems or questions, contact your health care provider. What can I expect after the procedure? After the procedure, it is common to have:  Pain, discomfort, or soreness.  Follow these instructions at home: Incision care  Follow instructions from your health care provider about how to take care of your incision. Make sure you: ? Wash your hands with soap and water before you change your bandage (dressing) or before you touch your abdomen. If soap and water are not available, use hand sanitizer. ? Change your dressing as told by your health care provider. ? Leave stitches (sutures), skin glue, or adhesive strips in place. These skin closures may need to stay in place for 2 weeks or longer. If adhesive strip edges start to loosen and curl up, you may trim the loose edges. Do not remove adhesive strips completely unless your health care provider tells you to do that.  Check your incision area every day for signs of infection. Check for: ? Redness, swelling, or pain. ? Fluid or blood. ? Warmth. ? Pus or a bad smell. Bathing  Do not take baths, swim, or use a hot tub until your health care provider approves. Ask your health care provider if you can take showers. You may only be allowed to take sponge baths for bathing.  Keep your bandage (dressing) dry until your health care provider says it can be removed. Activity  Do not lift anything that is heavier than 10 lb (4.5 kg) until your health care provider approves.  Do not drive or use heavy machinery while taking prescription pain medicine. Ask your health care provider when it is safe for you to drive or use heavy machinery.  Do not drive for 24 hours if you were given a medicine to help you relax (sedative) during your  procedure.  Rest as told by your health care provider. You may return to your normal activities when your health care provider approves. General instructions  Take over-the-counter and prescription medicines only as told by your health care provider.  To prevent or treat constipation while you are taking prescription pain medicine, your health care provider may recommend that you: ? Take over-the-counter or prescription medicines. ? Eat foods that are high in fiber, such as fresh fruits and vegetables, whole grains, and beans. ? Limit foods that are high in fat and processed sugars, such as fried and sweet foods.  Drink enough fluid to keep your urine clear or pale yellow.  Hold a pillow over your abdomen when you cough or sneeze. This helps with pain.  Keep all follow-up visits as told by your health care provider. This is important. Contact a health care provider if:  You have: ? A fever or chills. ? Redness, swelling, or pain around your incision. ? Fluid or blood coming from your incision. ? Pus or a bad smell coming from your incision. ? Pain that gets worse or does not get better with medicine. ? Nausea or vomiting. ? A cough. ? Shortness of breath.  Your incision feels warm to the touch.  You have not had a bowel movement in three days.  You are not able to urinate. Get help right away if:  You have severe pain in your abdomen.  You have persistent nausea and vomiting.  You have redness,  warmth, or pain in your leg.  You have chest pain.  You have trouble breathing. Summary  After this procedure, it is common to have pain, discomfort, or soreness.  Follow instructions from your health care provider about how to take care of your incision.  Check your incision area every day for signs of infection. Report any signs of infection to your health care provider.  Keep all follow-up visits as told by your health care provider. This is important. This information  is not intended to replace advice given to you by your health care provider. Make sure you discuss any questions you have with your health care provider. Document Released: 10/28/2012 Document Revised: 07/03/2016 Document Reviewed: 07/03/2016 Elsevier Interactive Patient Education  2018 [place] Home Care Instructions  Activity: Get plenty of rest for the remainder of the day. A responsible individual must stay with you for 24 hours following the procedure.  For the next 24 hours, DO NOT: -Drive a car -Paediatric nurse -Drink alcoholic beverages -Take any medication unless instructed by your physician -Make any legal decisions or sign important papers.  Meals: Start with liquid foods such as gelatin or soup. Progress to regular foods as tolerated. Avoid greasy, spicy, heavy foods. If nausea and/or vomiting occur, drink only clear liquids until the nausea and/or vomiting subsides. Call your physician if vomiting continues.  Special Instructions/Symptoms: Your throat may feel dry or sore from the anesthesia or the breathing tube placed in your throat during surgery. If this causes discomfort, gargle with warm salt water. The discomfort should disappear within 24 hours.  If you had a scopolamine patch placed behind your ear for the management of post- operative nausea and/or vomiting:  1. The medication in the patch is effective for 72 hours, after which it should be removed.  Wrap patch in a tissue and discard in the trash. Wash hands thoroughly with soap and water. 2. You may remove the patch earlier than 72 hours if you experience unpleasant side effects which may include dry mouth, dizziness or visual disturbances. 3. Avoid touching the patch. Wash your hands with soap and water after contact with the patch.

## 2018-08-06 NOTE — Brief Op Note (Signed)
08/06/2018  8:32 AM  PATIENT:  Orbie Pyo  62 y.o. female  PRE-OPERATIVE DIAGNOSIS:  incisional hernia  POST-OPERATIVE DIAGNOSIS:  incisional hernia  PROCEDURE:  Procedure(s): REPAIR OF INCISIONAL HERNIA (N/A)  SURGEON:  Surgeon(s) and Role:    * Arvella Nigh, MD - Primary  PHYSICIAN ASSISTANT:   ASSISTANTS: none   ANESTHESIA:   general  EBL:  150 mL   BLOOD ADMINISTERED:none  DRAINS: none   LOCAL MEDICATIONS USED:  NONE  SPECIMEN:  No Specimen  DISPOSITION OF SPECIMEN:  N/A  COUNTS:  YES  TOURNIQUET:  * No tourniquets in log *  DICTATION: .Other Dictation: Dictation Number [id number]  PLAN OF CARE: Discharge to home after PACU  PATIENT DISPOSITION:  PACU - hemodynamically stable.   Delay start of Pharmacological VTE agent (>24hrs) due to surgical blood loss or risk of bleeding: not applicable

## 2018-08-06 NOTE — Anesthesia Preprocedure Evaluation (Signed)
Anesthesia Evaluation  Patient identified by MRN, date of birth, ID band Patient awake    Reviewed: Allergy & Precautions, NPO status , Patient's Chart, lab work & pertinent test results  History of Anesthesia Complications (+) PONV and history of anesthetic complications  Airway Mallampati: II       Dental no notable dental hx.    Pulmonary former smoker,    Pulmonary exam normal breath sounds clear to auscultation       Cardiovascular negative cardio ROS Normal cardiovascular exam Rhythm:Regular Rate:Normal     Neuro/Psych negative psych ROS   GI/Hepatic negative GI ROS, Neg liver ROS,   Endo/Other  negative endocrine ROS  Renal/GU negative Renal ROS  negative genitourinary   Musculoskeletal   Abdominal Normal abdominal exam  (+)   Peds negative pediatric ROS (+)  Hematology negative hematology ROS (+)   Anesthesia Other Findings   Reproductive/Obstetrics negative OB ROS                             Anesthesia Physical Anesthesia Plan  ASA: II  Anesthesia Plan: General   Post-op Pain Management:    Induction: Intravenous  PONV Risk Score and Plan: 4 or greater and Ondansetron, Dexamethasone, Scopolamine patch - Pre-op and Midazolam  Airway Management Planned: LMA  Additional Equipment:   Intra-op Plan:   Post-operative Plan:   Informed Consent: I have reviewed the patients History and Physical, chart, labs and discussed the procedure including the risks, benefits and alternatives for the proposed anesthesia with the patient or authorized representative who has indicated his/her understanding and acceptance.   Dental advisory given  Plan Discussed with: CRNA and Surgeon  Anesthesia Plan Comments:         Anesthesia Quick Evaluation

## 2018-08-07 ENCOUNTER — Encounter (HOSPITAL_COMMUNITY): Payer: Self-pay | Admitting: Obstetrics and Gynecology

## 2018-09-10 NOTE — H&P (Signed)
TOTAL HIP ADMISSION H&P  Patient is admitted for left total hip arthroplasty.  Subjective:  Chief Complaint: left hip pain  HPI: Rachel Fleming, 62 y.o. female, has a history of pain and functional disability in the left hip(s) due to arthritis and patient has failed non-surgical conservative treatments for greater than 12 weeks to include NSAID's and/or analgesics and activity modification.  Onset of symptoms was gradual starting several years ago with gradually worsening course since that time.The patient noted no past surgery on the left hip(s).  Patient currently rates pain in the left hip at 7 out of 10 with activity. Patient has worsening of pain with activity and weight bearing and instability. Patient has evidence of subchondral cysts and joint space narrowing by imaging studies. This condition presents safety issues increasing the risk of falls. There is no current active infection.  Patient Active Problem List   Diagnosis Date Noted  . OA (osteoarthritis) of hip 12/03/2017  . Heart palpitations 12/12/2011  . CARDIAC MURMUR 03/06/2010   Past Medical History:  Diagnosis Date  . Aortic insufficiency    echo 7/19  . Aortic valve regurgitation 2019   Mild per ECHO 05/2018  . Arthritis    hands, hips, neck - otc med prn  . Heart murmur    never has caused any problems  . History of kidney stones 2017   passed stone no surgery required  . PONV (postoperative nausea and vomiting)    scop patch, zofran & decadron works great for patient - see 07/09/18 anesthesia event    Past Surgical History:  Procedure Laterality Date  . ABDOMINAL HYSTERECTOMY  2004  . ABDOMINAL SURGERY  1996   mass remaved  . COLONOSCOPY     normal  . LAPAROSCOPIC BILATERAL SALPINGO OOPHERECTOMY Bilateral 07/09/2018   Procedure: LAPAROSCOPIC BILATERAL SALPINGO OOPHORECTOMY, cystocopy;  Surgeon: Arvella Nigh, MD;  Location: [facility];  Service: Gynecology;  Laterality: Bilateral;  .  SPLENECTOMY, TOTAL  1996  . TOTAL HIP ARTHROPLASTY Right 12/03/2017   Procedure: RIGHT TOTAL HIP ARTHROPLASTY ANTERIOR APPROACH;  Surgeon: Gaynelle Arabian, MD;  Location: [facility];  Service: Orthopedics;  Laterality: Right;  . TUBAL LIGATION N/A 08/06/2018   Procedure: REPAIR OF INCISIONAL HERNIA;  Surgeon: Arvella Nigh, MD;  Location: [facility];  Service: Gynecology;  Laterality: N/A;  . WISDOM TOOTH EXTRACTION      No current facility-administered medications for this encounter.    Current Outpatient Medications  Medication Sig Dispense Refill Last Dose  . Multiple Vitamin (MULTIVITAMIN) tablet Take 1 tablet by mouth daily.   08/05/2018 at Unknown time  . naproxen sodium (ALEVE) 220 MG tablet Take 440 mg by mouth daily. May take an additional 440mg  dose at night as needed for pain   08/05/2018 at Unknown time  . OVER THE COUNTER MEDICATION Take 3 tablets by mouth daily. Instaflex Joint supplement   08/05/2018 at Unknown time  . OVER THE COUNTER MEDICATION Apply 1 application topically daily as needed (arthritis pain). CBD cream   Past Week at Unknown time  . Polyethyl Glycol-Propyl Glycol (SYSTANE OP) Place 1 drop into both eyes 3 (three) times daily.   08/06/2018 at Unknown time   Allergies  Allergen Reactions  . Betadine [Povidone Iodine] Other (See Comments)    Anxiety,  Makes her climb the walls    Social History   Tobacco Use  . Smoking status: Former Smoker    Packs/day: 0.25    Years: 8.00    Pack  years: 2.00    Types: Cigarettes  . Smokeless tobacco: Never Used  . Tobacco comment: quit at 26   Substance Use Topics  . Alcohol use: No    Family History  Problem Relation Age of Onset  . Gallstones Mother   . High Cholesterol Father   . Kidney Stones Father   . Cancer Maternal Uncle   . Ovarian cancer Maternal Grandmother   . Bone cancer Maternal Grandfather   . Leukemia Maternal Grandfather   . Heart disease Paternal Grandmother   . Arthritis Paternal Grandmother      Review  of Systems  Constitutional: Negative for chills and fever.  HENT: Negative for congestion, sore throat and tinnitus.   Eyes: Negative for double vision, photophobia and pain.  Respiratory: Negative for cough, shortness of breath and wheezing.   Cardiovascular: Negative for chest pain, palpitations and orthopnea.  Gastrointestinal: Negative for heartburn, nausea and vomiting.  Genitourinary: Negative for dysuria, frequency and urgency.  Musculoskeletal: Positive for joint pain.  Neurological: Negative for dizziness, weakness and headaches.    Objective:  Physical Exam  Well nourished and well developed.  General: Alert and oriented x3, cooperative and pleasant, no acute distress. Head: normocephalic, atraumatic, neck supple. Eyes: EOMI. Respiratory: breath sounds clear in all fields, no wheezing, rales, or rhonchi. Cardiovascular: Regular rate and rhythm, no murmurs, gallops or rubs.  Abdomen: non-tender to palpation and soft, normoactive bowel sounds. Musculoskeletal: Antalgic gait pattern to the left side.  Left HIp Exam: No tenderness about the greater trochanter. Pain with passive ROM of the hip. ROM 120 flexion, 30 internal, 30 external , 40 abduction. Pain with provacative testing.  Calves soft and nontender. Motor function intact in LE. Strength 5/5 LE bilaterally. Neuro: Distal pulses 2+. Sensation to light touch intact in LE.  Vital signs in last 24 hours: Blood pressure: 130/68 mmHg Pulse: 76 bpm   Labs:   Estimated body mass index is 21.52 kg/m as calculated from the following:   Height as of 08/05/18: 5\' 4"  (1.626 m).   Weight as of 08/05/18: 56.9 kg.   Imaging Review Plain radiographs demonstrate severe degenerative joint disease of the left hip(s). The bone quality appears to be adequate for age and reported activity level.    Preoperative templating of the joint replacement has been completed, documented, and submitted to the Operating Room personnel in order  to optimize intra-operative equipment management.     Assessment/Plan:  End stage arthritis, left hip(s)  The patient history, physical examination, clinical judgement of the provider and imaging studies are consistent with end stage degenerative joint disease of the left hip(s) and total hip arthroplasty is deemed medically necessary. The treatment options including medical management, injection therapy, arthroscopy and arthroplasty were discussed at length. The risks and benefits of total hip arthroplasty were presented and reviewed. The risks due to aseptic loosening, infection, stiffness, dislocation/subluxation,  thromboembolic complications and other imponderables were discussed.  The patient acknowledged the explanation, agreed to proceed with the plan and consent was signed. Patient is being admitted for inpatient treatment for surgery, pain control, PT, OT, prophylactic antibiotics, VTE prophylaxis, progressive ambulation and ADL's and discharge planning.The patient is planning to be discharged home with home exercise program.    Therapy Plans: HEP Disposition: Home with husband Planned DVT Prophylaxis: Aspirin 325 mg BID DME needed: None PCP: Bradly Bienenstock, FNP Cardiologist: Dr. Percival Spanish TXA: IV Allergies: Betadine (anxiety) Anesthesia Concerns: Nausea/vomiting, alleviated with scopolamine patch Other: Patient does not want any narcotic  prescriptions following surgery.   - Patient was instructed on what medications to stop prior to surgery. - Follow-up visit in 2 weeks with Dr. Wynelle Link - Begin physical therapy following surgery - Pre-operative lab work as pre-surgical testing - Prescriptions will be provided in hospital at time of discharge  Theresa Duty, PA-C Orthopedic Surgery [facility] [place]

## 2018-09-18 NOTE — Progress Notes (Addendum)
Cardiac clearance , Melina Copa PA-C 06-26-18 epic   Medical clearance Bradly Bienenstock , NP-C 08-21-18 on chart  ECHO 06-11-18 epic   EKG 05-15-18 epic   [id number] on chart from [facility]

## 2018-09-18 NOTE — Patient Instructions (Addendum)
Rachel Fleming  09/18/2018   Your procedure is scheduled on: 09-28-18   Report to [facility] Main  Entrance    Report to admitting at 10:00AM    Call this number if you have problems the morning of surgery [phone]     Remember: Do not eat food or drink liquids :After Midnight. BRUSH YOUR TEETH MORNING OF SURGERY AND RINSE YOUR MOUTH OUT, NO CHEWING GUM CANDY OR MINTS.     Take these medicines the morning of surgery with A SIP OF WATER: systane eye drops                                 You may not have any metal on your body including hair pins and              piercings  Do not wear jewelry, make-up, lotions, powders or perfumes, deodorant             Do not wear nail polish.  Do not shave  48 hours prior to surgery.                Do not bring valuables to the hospital. [place].  Contacts, dentures or bridgework may not be worn into surgery.  Leave suitcase in the car. After surgery it may be brought to your room.                 Please read over the following fact sheets you were given: _____________________________________________________________________             [facility] - Preparing for Surgery Before surgery, you can play an important role.  Because skin is not sterile, your skin needs to be as free of germs as possible.  You can reduce the number of germs on your skin by washing with CHG (chlorahexidine gluconate) soap before surgery.  CHG is an antiseptic cleaner which kills germs and bonds with the skin to continue killing germs even after washing. Please DO NOT use if you have an allergy to CHG or antibacterial soaps.  If your skin becomes reddened/irritated stop using the CHG and inform your nurse when you arrive at Short Stay. Do not shave (including legs and underarms) for at least 48 hours prior to the first CHG shower.  You may shave your face/neck. Please follow these  instructions carefully:  1.  Shower with CHG Soap the night before surgery and the  morning of Surgery.  2.  If you choose to wash your hair, wash your hair first as usual with your  normal  shampoo.  3.  After you shampoo, rinse your hair and body thoroughly to remove the  shampoo.                           4.  Use CHG as you would any other liquid soap.  You can apply chg directly  to the skin and wash                       Gently with a scrungie or clean washcloth.  5.  Apply the CHG Soap to your body ONLY FROM THE NECK DOWN.  Do not use on face/ open                           Wound or open sores. Avoid contact with eyes, ears mouth and genitals (private parts).                       Wash face,  Genitals (private parts) with your normal soap.             6.  Wash thoroughly, paying special attention to the area where your surgery  will be performed.  7.  Thoroughly rinse your body with warm water from the neck down.  8.  DO NOT shower/wash with your normal soap after using and rinsing off  the CHG Soap.                9.  Pat yourself dry with a clean towel.            10.  Wear clean pajamas.            11.  Place clean sheets on your bed the night of your first shower and do not  sleep with pets. Day of Surgery : Do not apply any lotions/deodorants the morning of surgery.  Please wear clean clothes to the hospital/surgery center.  FAILURE TO FOLLOW THESE INSTRUCTIONS MAY RESULT IN THE CANCELLATION OF YOUR SURGERY PATIENT SIGNATURE_________________________________  NURSE SIGNATURE__________________________________  ________________________________________________________________________   [name]  An incentive spirometer is a tool that can help keep your lungs clear and active. This tool measures how well you are filling your lungs with each breath. Taking long deep breaths may help reverse or decrease the chance of developing breathing (pulmonary) problems (especially  infection) following:  A long period of time when you are unable to move or be active. BEFORE THE PROCEDURE   If the spirometer includes an indicator to show your best effort, your nurse or respiratory therapist will set it to a desired goal.  If possible, sit up straight or lean slightly forward. Try not to slouch.  Hold the incentive spirometer in an upright position. INSTRUCTIONS FOR USE  1. Sit on the edge of your bed if possible, or sit up as far as you can in bed or on a chair. 2. Hold the incentive spirometer in an upright position. 3. Breathe out normally. 4. Place the mouthpiece in your mouth and seal your lips tightly around it. 5. Breathe in slowly and as deeply as possible, raising the piston or the ball toward the top of the column. 6. Hold your breath for 3-5 seconds or for as long as possible. Allow the piston or ball to fall to the bottom of the column. 7. Remove the mouthpiece from your mouth and breathe out normally. 8. Rest for a few seconds and repeat Steps 1 through 7 at least 10 times every 1-2 hours when you are awake. Take your time and take a few normal breaths between deep breaths. 9. The spirometer may include an indicator to show your best effort. Use the indicator as a goal to work toward during each repetition. 10. After each set of 10 deep breaths, practice coughing to be sure your lungs are clear. If you have an incision (the cut made at the time of surgery), support your incision when coughing by placing a pillow or rolled up towels firmly against it. Once you are able to get out of  bed, walk around indoors and cough well. You may stop using the incentive spirometer when instructed by your caregiver.  RISKS AND COMPLICATIONS  Take your time so you do not get dizzy or light-headed.  If you are in pain, you may need to take or ask for pain medication before doing incentive spirometry. It is harder to take a deep breath if you are having pain. AFTER  USE  Rest and breathe slowly and easily.  It can be helpful to keep track of a log of your progress. Your caregiver can provide you with a simple table to help with this. If you are using the spirometer at home, follow these instructions: [place] IF:   You are having difficultly using the spirometer.  You have trouble using the spirometer as often as instructed.  Your pain medication is not giving enough relief while using the spirometer.  You develop fever of 100.5 F (38.1 C) or higher. SEEK IMMEDIATE MEDICAL CARE IF:   You cough up bloody sputum that had not been present before.  You develop fever of 102 F (38.9 C) or greater.  You develop worsening pain at or near the incision site. MAKE SURE YOU:   Understand these instructions.  Will watch your condition.  Will get help right away if you are not doing well or get worse. Document Released: 03/24/2007 Document Revised: 02/03/2012 Document Reviewed: 05/25/2007 ExitCare Patient Information 2014 Exit[employer], [place].   ________________________________________________________________________  WHAT IS A BLOOD TRANSFUSION? Blood Transfusion Information  A transfusion is the replacement of blood or some of its parts. Blood is made up of multiple cells which provide different functions.  Red blood cells carry oxygen and are used for blood loss replacement.  White blood cells fight against infection.  Platelets control bleeding.  Plasma helps clot blood.  Other blood products are available for specialized needs, such as hemophilia or other clotting disorders. BEFORE THE TRANSFUSION  Who gives blood for transfusions?   Healthy volunteers who are fully evaluated to make sure their blood is safe. This is blood bank blood. Transfusion therapy is the safest it has ever been in the practice of medicine. Before blood is taken from a donor, a complete history is taken to make sure that person has no history of diseases  nor engages in risky social behavior (examples are intravenous drug use or sexual activity with multiple partners). The donor's travel history is screened to minimize risk of transmitting infections, such as malaria. The donated blood is tested for signs of infectious diseases, such as HIV and hepatitis. The blood is then tested to be sure it is compatible with you in order to minimize the chance of a transfusion reaction. If you or a relative donates blood, this is often done in anticipation of surgery and is not appropriate for emergency situations. It takes many days to process the donated blood. RISKS AND COMPLICATIONS Although transfusion therapy is very safe and saves many lives, the main dangers of transfusion include:   Getting an infectious disease.  Developing a transfusion reaction. This is an allergic reaction to something in the blood you were given. Every precaution is taken to prevent this. The decision to have a blood transfusion has been considered carefully by your caregiver before blood is given. Blood is not given unless the benefits outweigh the risks. AFTER THE TRANSFUSION  Right after receiving a blood transfusion, you will usually feel much better and more energetic. This is especially true if your red blood  cells have gotten low (anemic). The transfusion raises the level of the red blood cells which carry oxygen, and this usually causes an energy increase.  The nurse administering the transfusion will monitor you carefully for complications. HOME CARE INSTRUCTIONS  No special instructions are needed after a transfusion. You may find your energy is better. Speak with your caregiver about any limitations on activity for underlying diseases you may have. SEEK MEDICAL CARE IF:   Your condition is not improving after your transfusion.  You develop redness or irritation at the intravenous (IV) site. SEEK IMMEDIATE MEDICAL CARE IF:  Any of the following symptoms occur over the  next 12 hours:  Shaking chills.  You have a temperature by mouth above 102 F (38.9 C), not controlled by medicine.  Chest, back, or muscle pain.  People around you feel you are not acting correctly or are confused.  Shortness of breath or difficulty breathing.  Dizziness and fainting.  You get a rash or develop hives.  You have a decrease in urine output.  Your urine turns a dark color or changes to pink, red, or Jacober. Any of the following symptoms occur over the next 10 days:  You have a temperature by mouth above 102 F (38.9 C), not controlled by medicine.  Shortness of breath.  Weakness after normal activity.  The white part of the eye turns yellow (jaundice).  You have a decrease in the amount of urine or are urinating less often.  Your urine turns a dark color or changes to pink, red, or Stober. Document Released: 11/08/2000 Document Revised: 02/03/2012 Document Reviewed: 06/27/2008 [facility] Patient Information 2014 [place], [place].  _______________________________________________________________________

## 2018-09-21 ENCOUNTER — Other Ambulatory Visit: Payer: Self-pay

## 2018-09-21 ENCOUNTER — Encounter (HOSPITAL_COMMUNITY)
Admission: RE | Admit: 2018-09-21 | Discharge: 2018-09-21 | Disposition: A | Discharge status: Home / Self Care | Payer: Commercial Managed Care - PPO | Source: Ambulatory Visit | Attending: Orthopedic Surgery | Admitting: Orthopedic Surgery

## 2018-09-21 ENCOUNTER — Encounter (HOSPITAL_COMMUNITY): Payer: Self-pay

## 2018-09-21 DIAGNOSIS — Z01812 Encounter for preprocedural laboratory examination: Secondary | ICD-10-CM | POA: Diagnosis present

## 2018-09-21 HISTORY — DX: Dry eye syndrome of unspecified lacrimal gland: H04.129

## 2018-09-21 LAB — CBC
HEMATOCRIT: 38.6 % (ref 36.0–46.0)
HEMOGLOBIN: 12.3 g/dL (ref 12.0–15.0)
MCH: 30.7 pg (ref 26.0–34.0)
MCHC: 31.9 g/dL (ref 30.0–36.0)
MCV: 96.3 fL (ref 80.0–100.0)
Platelets: 320 10*3/uL (ref 150–400)
RBC: 4.01 MIL/uL (ref 3.87–5.11)
RDW: 13.1 % (ref 11.5–15.5)
WBC: 6.6 10*3/uL (ref 4.0–10.5)
nRBC: 0 % (ref 0.0–0.2)

## 2018-09-21 LAB — COMPREHENSIVE METABOLIC PANEL
ALBUMIN: 4.1 g/dL (ref 3.5–5.0)
ALK PHOS: 81 U/L (ref 38–126)
ALT: 16 U/L (ref 0–44)
AST: 22 U/L (ref 15–41)
Anion gap: 6 (ref 5–15)
BILIRUBIN TOTAL: 1.1 mg/dL (ref 0.3–1.2)
BUN: 28 mg/dL — ABNORMAL HIGH (ref 8–23)
CALCIUM: 9.4 mg/dL (ref 8.9–10.3)
CO2: 27 mmol/L (ref 22–32)
Chloride: 107 mmol/L (ref 98–111)
Creatinine, Ser: 0.5 mg/dL (ref 0.44–1.00)
GFR calc non Af Amer: >60 mL/min (ref >60)
Glucose, Bld: 90 mg/dL (ref 70–99)
POTASSIUM: 4.2 mmol/L (ref 3.5–5.1)
Sodium: 140 mmol/L (ref 135–145)
TOTAL PROTEIN: 6.9 g/dL (ref 6.5–8.1)

## 2018-09-21 LAB — APTT: APTT: 29 s (ref 24–36)

## 2018-09-21 LAB — PROTIME-INR
INR: 0.92
PROTHROMBIN TIME: 12.3 s (ref 11.4–15.2)

## 2018-09-21 LAB — SURGICAL PCR SCREEN
MRSA, PCR: NEGATIVE
Staphylococcus aureus: NEGATIVE

## 2018-09-28 ENCOUNTER — Inpatient Hospital Stay (HOSPITAL_COMMUNITY): Payer: Commercial Managed Care - PPO

## 2018-09-28 ENCOUNTER — Other Ambulatory Visit: Payer: Self-pay

## 2018-09-28 ENCOUNTER — Inpatient Hospital Stay (HOSPITAL_COMMUNITY): Payer: Commercial Managed Care - PPO | Admitting: Certified Registered Nurse Anesthetist

## 2018-09-28 ENCOUNTER — Encounter (HOSPITAL_COMMUNITY)
Admission: RE | Disposition: A | Discharge status: Home / Self Care | Payer: Self-pay | Source: Home / Self Care | Attending: Orthopedic Surgery

## 2018-09-28 ENCOUNTER — Encounter (HOSPITAL_COMMUNITY): Payer: Self-pay | Admitting: *Deleted

## 2018-09-28 ENCOUNTER — Inpatient Hospital Stay (HOSPITAL_COMMUNITY)
Admission: RE | Admit: 2018-09-28 | Discharge: 2018-09-29 | DRG: 470 | Disposition: A | Discharge status: Home / Self Care | Payer: Commercial Managed Care - PPO | Attending: Orthopedic Surgery | Admitting: Orthopedic Surgery

## 2018-09-28 DIAGNOSIS — Z8349 Family history of other endocrine, nutritional and metabolic diseases: Secondary | ICD-10-CM

## 2018-09-28 DIAGNOSIS — Z87891 Personal history of nicotine dependence: Secondary | ICD-10-CM | POA: Diagnosis not present

## 2018-09-28 DIAGNOSIS — Z87442 Personal history of urinary calculi: Secondary | ICD-10-CM | POA: Diagnosis not present

## 2018-09-28 DIAGNOSIS — R011 Cardiac murmur, unspecified: Secondary | ICD-10-CM | POA: Diagnosis present

## 2018-09-28 DIAGNOSIS — Z8041 Family history of malignant neoplasm of ovary: Secondary | ICD-10-CM | POA: Diagnosis not present

## 2018-09-28 DIAGNOSIS — Z90710 Acquired absence of both cervix and uterus: Secondary | ICD-10-CM

## 2018-09-28 DIAGNOSIS — Z9081 Acquired absence of spleen: Secondary | ICD-10-CM

## 2018-09-28 DIAGNOSIS — Z791 Long term (current) use of non-steroidal anti-inflammatories (NSAID): Secondary | ICD-10-CM | POA: Diagnosis not present

## 2018-09-28 DIAGNOSIS — M1612 Unilateral primary osteoarthritis, left hip: Principal | ICD-10-CM | POA: Diagnosis present

## 2018-09-28 DIAGNOSIS — Z806 Family history of leukemia: Secondary | ICD-10-CM

## 2018-09-28 DIAGNOSIS — Z96641 Presence of right artificial hip joint: Secondary | ICD-10-CM | POA: Diagnosis present

## 2018-09-28 DIAGNOSIS — M169 Osteoarthritis of hip, unspecified: Secondary | ICD-10-CM | POA: Diagnosis present

## 2018-09-28 DIAGNOSIS — Z888 Allergy status to other drugs, medicaments and biological substances status: Secondary | ICD-10-CM

## 2018-09-28 DIAGNOSIS — Z79899 Other long term (current) drug therapy: Secondary | ICD-10-CM | POA: Diagnosis not present

## 2018-09-28 DIAGNOSIS — I351 Nonrheumatic aortic (valve) insufficiency: Secondary | ICD-10-CM | POA: Diagnosis present

## 2018-09-28 DIAGNOSIS — Z419 Encounter for procedure for purposes other than remedying health state, unspecified: Secondary | ICD-10-CM

## 2018-09-28 DIAGNOSIS — Z96649 Presence of unspecified artificial hip joint: Secondary | ICD-10-CM

## 2018-09-28 DIAGNOSIS — Z8249 Family history of ischemic heart disease and other diseases of the circulatory system: Secondary | ICD-10-CM

## 2018-09-28 DIAGNOSIS — R002 Palpitations: Secondary | ICD-10-CM | POA: Diagnosis present

## 2018-09-28 DIAGNOSIS — M25552 Pain in left hip: Secondary | ICD-10-CM | POA: Diagnosis present

## 2018-09-28 HISTORY — PX: TOTAL HIP ARTHROPLASTY: SHX124

## 2018-09-28 LAB — TYPE AND SCREEN
ABO/RH(D): O POS
ANTIBODY SCREEN: NEGATIVE

## 2018-09-28 SURGERY — ARTHROPLASTY, HIP, TOTAL, ANTERIOR APPROACH
Anesthesia: Spinal | Site: Hip | Laterality: Left

## 2018-09-28 MED ORDER — LACTATED RINGERS IV SOLN
INTRAVENOUS | Status: DC
  Administered 2018-09-28 (×2): via INTRAVENOUS

## 2018-09-28 MED ORDER — ONDANSETRON HCL 4 MG/2ML IJ SOLN: 4.0000 mg | Freq: Four times a day (QID) | INTRAMUSCULAR | Status: DC | PRN

## 2018-09-28 MED ORDER — FENTANYL CITRATE (PF) 100 MCG/2ML IJ SOLN
25.0000 ug | INTRAMUSCULAR | Status: DC | PRN
  Administered 2018-09-28 (×3): 50 ug via INTRAVENOUS

## 2018-09-28 MED ORDER — OXYCODONE HCL 5 MG/5ML PO SOLN: 5.0000 mg | Freq: Once | ORAL | Status: DC | PRN

## 2018-09-28 MED ORDER — SODIUM CHLORIDE 0.9 % IV SOLN
INTRAVENOUS | Status: DC
  Administered 2018-09-28 – 2018-09-29 (×2): via INTRAVENOUS

## 2018-09-28 MED ORDER — PROPOFOL 500 MG/50ML IV EMUL
INTRAVENOUS | Status: DC | PRN
  Administered 2018-09-28: 50 ug/kg/min via INTRAVENOUS

## 2018-09-28 MED ORDER — DEXAMETHASONE SODIUM PHOSPHATE 10 MG/ML IJ SOLN
INTRAMUSCULAR | Status: AC
  Filled 2018-09-28: qty 1

## 2018-09-28 MED ORDER — DIPHENHYDRAMINE HCL 12.5 MG/5ML PO ELIX: 12.5000 mg | ORAL_SOLUTION | ORAL | Status: DC | PRN

## 2018-09-28 MED ORDER — METHOCARBAMOL 500 MG IVPB - SIMPLE MED
500.0000 mg | Freq: Four times a day (QID) | INTRAVENOUS | Status: DC | PRN
  Administered 2018-09-28: 500 mg via INTRAVENOUS
  Filled 2018-09-28: qty 50

## 2018-09-28 MED ORDER — DEXAMETHASONE SODIUM PHOSPHATE 10 MG/ML IJ SOLN
10.0000 mg | Freq: Once | INTRAMUSCULAR | Status: AC
  Administered 2018-09-29: 10 mg via INTRAVENOUS
  Filled 2018-09-28: qty 1

## 2018-09-28 MED ORDER — CHLORHEXIDINE GLUCONATE 4 % EX LIQD: 60.0000 mL | Freq: Once | CUTANEOUS | Status: DC

## 2018-09-28 MED ORDER — PROPOFOL 10 MG/ML IV BOLUS
INTRAVENOUS | Status: AC
  Filled 2018-09-28: qty 20

## 2018-09-28 MED ORDER — FLEET ENEMA 7-19 GM/118ML RE ENEM: 1.0000 | ENEMA | Freq: Once | RECTAL | Status: DC | PRN

## 2018-09-28 MED ORDER — FENTANYL CITRATE (PF) 100 MCG/2ML IJ SOLN
INTRAMUSCULAR | Status: AC
  Administered 2018-09-28: 50 ug via INTRAVENOUS
  Filled 2018-09-28: qty 4

## 2018-09-28 MED ORDER — MIDAZOLAM HCL 5 MG/5ML IJ SOLN
INTRAMUSCULAR | Status: DC | PRN
  Administered 2018-09-28: 2 mg via INTRAVENOUS

## 2018-09-28 MED ORDER — BUPIVACAINE HCL (PF) 0.25 % IJ SOLN
INTRAMUSCULAR | Status: AC
  Filled 2018-09-28: qty 30

## 2018-09-28 MED ORDER — BUPIVACAINE IN DEXTROSE 0.75-8.25 % IT SOLN
INTRATHECAL | Status: DC | PRN
  Administered 2018-09-28: 1.8 mL via INTRATHECAL

## 2018-09-28 MED ORDER — METHOCARBAMOL 500 MG IVPB - SIMPLE MED
INTRAVENOUS | Status: AC
  Administered 2018-09-28: 500 mg via INTRAVENOUS
  Filled 2018-09-28: qty 50

## 2018-09-28 MED ORDER — SODIUM CHLORIDE 0.9 % IV BOLUS
500.0000 mL | Freq: Once | INTRAVENOUS | Status: AC
  Administered 2018-09-28: 500 mL via INTRAVENOUS

## 2018-09-28 MED ORDER — BISACODYL 10 MG RE SUPP: 10.0000 mg | Freq: Every day | RECTAL | Status: DC | PRN

## 2018-09-28 MED ORDER — STERILE WATER FOR IRRIGATION IR SOLN
Status: DC | PRN
  Administered 2018-09-28: 2000 mL

## 2018-09-28 MED ORDER — ACETAMINOPHEN 10 MG/ML IV SOLN
1000.0000 mg | Freq: Four times a day (QID) | INTRAVENOUS | Status: DC
  Administered 2018-09-28: 1000 mg via INTRAVENOUS
  Filled 2018-09-28: qty 100

## 2018-09-28 MED ORDER — DOCUSATE SODIUM 100 MG PO CAPS
100.0000 mg | ORAL_CAPSULE | Freq: Two times a day (BID) | ORAL | Status: DC
  Administered 2018-09-28 – 2018-09-29 (×2): 100 mg via ORAL
  Filled 2018-09-28 (×2): qty 1

## 2018-09-28 MED ORDER — MIDAZOLAM HCL 2 MG/2ML IJ SOLN
INTRAMUSCULAR | Status: AC
  Filled 2018-09-28: qty 2

## 2018-09-28 MED ORDER — CEFAZOLIN SODIUM-DEXTROSE 1-4 GM/50ML-% IV SOLN
1.0000 g | Freq: Four times a day (QID) | INTRAVENOUS | Status: AC
  Administered 2018-09-28 – 2018-09-29 (×2): 1 g via INTRAVENOUS
  Filled 2018-09-28 (×2): qty 50

## 2018-09-28 MED ORDER — TRANEXAMIC ACID-NACL 1000-0.7 MG/100ML-% IV SOLN
1000.0000 mg | Freq: Once | INTRAVENOUS | Status: AC
  Administered 2018-09-28: 1000 mg via INTRAVENOUS
  Filled 2018-09-28 (×3): qty 100

## 2018-09-28 MED ORDER — OXYCODONE HCL 5 MG PO TABS: 5.0000 mg | ORAL_TABLET | Freq: Once | ORAL | Status: DC | PRN

## 2018-09-28 MED ORDER — CEFAZOLIN SODIUM-DEXTROSE 2-4 GM/100ML-% IV SOLN
2.0000 g | INTRAVENOUS | Status: AC
  Administered 2018-09-28: 2 g via INTRAVENOUS
  Filled 2018-09-28: qty 100

## 2018-09-28 MED ORDER — ONDANSETRON HCL 4 MG PO TABS: 4.0000 mg | ORAL_TABLET | Freq: Four times a day (QID) | ORAL | Status: DC | PRN

## 2018-09-28 MED ORDER — METOCLOPRAMIDE HCL 5 MG/ML IJ SOLN: 5.0000 mg | Freq: Three times a day (TID) | INTRAMUSCULAR | Status: DC | PRN

## 2018-09-28 MED ORDER — TRANEXAMIC ACID-NACL 1000-0.7 MG/100ML-% IV SOLN
1000.0000 mg | INTRAVENOUS | Status: AC
  Administered 2018-09-28: 1000 mg via INTRAVENOUS
  Filled 2018-09-28: qty 100

## 2018-09-28 MED ORDER — PHENOL 1.4 % MT LIQD
1.0000 | OROMUCOSAL | Status: DC | PRN
  Filled 2018-09-28: qty 177

## 2018-09-28 MED ORDER — SCOPOLAMINE 1 MG/3DAYS TD PT72
1.0000 | MEDICATED_PATCH | TRANSDERMAL | Status: DC
  Administered 2018-09-28: 1.5 mg via TRANSDERMAL
  Filled 2018-09-28: qty 1

## 2018-09-28 MED ORDER — HYDROCODONE-ACETAMINOPHEN 5-325 MG PO TABS
1.0000 | ORAL_TABLET | ORAL | Status: DC | PRN
  Administered 2018-09-28: 2 via ORAL
  Filled 2018-09-28: qty 2

## 2018-09-28 MED ORDER — ASPIRIN EC 325 MG PO TBEC
325.0000 mg | DELAYED_RELEASE_TABLET | Freq: Two times a day (BID) | ORAL | Status: DC
  Administered 2018-09-29: 325 mg via ORAL
  Filled 2018-09-28: qty 1

## 2018-09-28 MED ORDER — MENTHOL 3 MG MT LOZG: 1.0000 | LOZENGE | OROMUCOSAL | Status: DC | PRN

## 2018-09-28 MED ORDER — FENTANYL CITRATE (PF) 100 MCG/2ML IJ SOLN
INTRAMUSCULAR | Status: AC
  Filled 2018-09-28: qty 2

## 2018-09-28 MED ORDER — 0.9 % SODIUM CHLORIDE (POUR BTL) OPTIME
TOPICAL | Status: DC | PRN
  Administered 2018-09-28: 1000 mL

## 2018-09-28 MED ORDER — TRAMADOL HCL 50 MG PO TABS
50.0000 mg | ORAL_TABLET | Freq: Four times a day (QID) | ORAL | Status: DC | PRN
  Administered 2018-09-29: 50 mg via ORAL
  Filled 2018-09-28: qty 1

## 2018-09-28 MED ORDER — ONDANSETRON HCL 4 MG/2ML IJ SOLN
INTRAMUSCULAR | Status: AC
  Filled 2018-09-28: qty 2

## 2018-09-28 MED ORDER — PROPOFOL 10 MG/ML IV BOLUS
INTRAVENOUS | Status: AC
  Filled 2018-09-28: qty 40

## 2018-09-28 MED ORDER — METHOCARBAMOL 500 MG PO TABS: 500.0000 mg | ORAL_TABLET | Freq: Four times a day (QID) | ORAL | Status: DC | PRN

## 2018-09-28 MED ORDER — POLYETHYLENE GLYCOL 3350 17 G PO PACK: 17.0000 g | PACK | Freq: Every day | ORAL | Status: DC | PRN

## 2018-09-28 MED ORDER — ONDANSETRON HCL 4 MG/2ML IJ SOLN
INTRAMUSCULAR | Status: DC | PRN
  Administered 2018-09-28: 4 mg via INTRAVENOUS

## 2018-09-28 MED ORDER — BUPIVACAINE-EPINEPHRINE (PF) 0.25% -1:200000 IJ SOLN
INTRAMUSCULAR | Status: DC | PRN
  Administered 2018-09-28: 30 mL

## 2018-09-28 MED ORDER — DEXAMETHASONE SODIUM PHOSPHATE 10 MG/ML IJ SOLN
8.0000 mg | Freq: Once | INTRAMUSCULAR | Status: AC
  Administered 2018-09-28: 10 mg via INTRAVENOUS

## 2018-09-28 MED ORDER — METOCLOPRAMIDE HCL 5 MG PO TABS: 5.0000 mg | ORAL_TABLET | Freq: Three times a day (TID) | ORAL | Status: DC | PRN

## 2018-09-28 MED ORDER — MORPHINE SULFATE (PF) 4 MG/ML IV SOLN: 0.5000 mg | INTRAVENOUS | Status: DC | PRN

## 2018-09-28 MED ORDER — ACETAMINOPHEN 500 MG PO TABS
500.0000 mg | ORAL_TABLET | Freq: Four times a day (QID) | ORAL | Status: AC
  Administered 2018-09-28 – 2018-09-29 (×4): 500 mg via ORAL
  Filled 2018-09-28 (×4): qty 1

## 2018-09-28 SURGICAL SUPPLY — 43 items
BAG DECANTER FOR FLEXI CONT (MISCELLANEOUS) ×1 IMPLANT
BAG SPEC THK2 15X12 ZIP CLS (MISCELLANEOUS)
BAG ZIPLOCK 12X15 (MISCELLANEOUS) IMPLANT
BLADE SAG 18X100X1.27 (BLADE) ×3 IMPLANT
CLOSURE WOUND 1/2 X4 (GAUZE/BANDAGES/DRESSINGS) ×2
COVER PERINEAL POST (MISCELLANEOUS) ×3 IMPLANT
COVER SURGICAL LIGHT HANDLE (MISCELLANEOUS) ×3 IMPLANT
COVER WAND RF STERILE (DRAPES) ×2 IMPLANT
CUP ACETBLR 48 OD SECTOR II (Hips) ×2 IMPLANT
DECANTER SPIKE VIAL GLASS SM (MISCELLANEOUS) ×3 IMPLANT
DRAPE STERI IOBAN 125X83 (DRAPES) ×3 IMPLANT
DRAPE U-SHAPE 47X51 STRL (DRAPES) ×6 IMPLANT
DRSG ADAPTIC 3X8 NADH LF (GAUZE/BANDAGES/DRESSINGS) ×3 IMPLANT
DRSG MEPILEX BORDER 4X4 (GAUZE/BANDAGES/DRESSINGS) ×3 IMPLANT
DRSG MEPILEX BORDER 4X8 (GAUZE/BANDAGES/DRESSINGS) ×3 IMPLANT
DURAPREP 26ML APPLICATOR (WOUND CARE) ×3 IMPLANT
ELECT REM PT RETURN 15FT ADLT (MISCELLANEOUS) ×3 IMPLANT
EVACUATOR 1/8 PVC DRAIN (DRAIN) ×3 IMPLANT
GLOVE BIO SURGEON STRL SZ7 (GLOVE) ×3 IMPLANT
GLOVE BIO SURGEON STRL SZ8 (GLOVE) ×3 IMPLANT
GLOVE BIOGEL PI IND STRL 7.0 (GLOVE) ×1 IMPLANT
GLOVE BIOGEL PI IND STRL 8 (GLOVE) ×1 IMPLANT
GLOVE BIOGEL PI INDICATOR 7.0 (GLOVE) ×2
GLOVE BIOGEL PI INDICATOR 8 (GLOVE) ×2
GOWN STRL REUS W/TWL LRG LVL3 (GOWN DISPOSABLE) ×3 IMPLANT
GOWN STRL REUS W/TWL XL LVL3 (GOWN DISPOSABLE) ×3 IMPLANT
HEAD CERAMIC DELTA 28 P1.5 HIP (Head) ×2 IMPLANT
HOLDER FOLEY CATH W/STRAP (MISCELLANEOUS) ×3 IMPLANT
LINER MARATHON 28 48 (Hips) ×2 IMPLANT
MANIFOLD NEPTUNE II (INSTRUMENTS) ×3 IMPLANT
PACK ANTERIOR HIP CUSTOM (KITS) ×3 IMPLANT
STEM FEM ACTIS HIGH SZ3 (Stem) ×2 IMPLANT
STRIP CLOSURE SKIN 1/2X4 (GAUZE/BANDAGES/DRESSINGS) ×3 IMPLANT
SUT ETHIBOND NAB CT1 #1 30IN (SUTURE) ×3 IMPLANT
SUT MNCRL AB 4-0 PS2 18 (SUTURE) ×3 IMPLANT
SUT STRATAFIX 0 PDS 27 VIOLET (SUTURE) ×3
SUT VIC AB 2-0 CT1 27 (SUTURE) ×6
SUT VIC AB 2-0 CT1 TAPERPNT 27 (SUTURE) ×2 IMPLANT
SUTURE STRATFX 0 PDS 27 VIOLET (SUTURE) ×1 IMPLANT
SYR 50ML LL SCALE MARK (SYRINGE) IMPLANT
TRAY FOLEY CATH 14FRSI W/METER (CATHETERS) ×2 IMPLANT
TRAY FOLEY MTR SLVR 16FR STAT (SET/KITS/TRAYS/PACK) ×1 IMPLANT
YANKAUER SUCT BULB TIP 10FT TU (MISCELLANEOUS) ×3 IMPLANT

## 2018-09-28 NOTE — Interval H&P Note (Signed)
History and Physical Interval Note:  09/28/2018 10:50 AM  Rachel Fleming  has presented today for surgery, with the diagnosis of left hip osteoarthritis  The various methods of treatment have been discussed with the patient and family. After consideration of risks, benefits and other options for treatment, the patient has consented to  Procedure(s) with comments: LEFT [place] (Left) - 172min as a surgical intervention .  The patient's history has been reviewed, patient examined, no change in status, stable for surgery.  I have reviewed the patient's chart and labs.  Questions were answered to the patient's satisfaction.     [name] Brendolyn Stockley

## 2018-09-28 NOTE — Anesthesia Procedure Notes (Signed)
Spinal  Patient location during procedure: OR Start time: 09/28/2018 1:10 PM End time: 09/28/2018 1:18 PM Staffing Anesthesiologist: Albertha Ghee, MD Performed: anesthesiologist  Preanesthetic Checklist Completed: patient identified, surgical consent, pre-op evaluation, timeout performed, IV checked, risks and benefits discussed and monitors and equipment checked Spinal Block Patient position: sitting Prep: DuraPrep Patient monitoring: cardiac monitor, continuous pulse ox and blood pressure Approach: midline Location: L3-4 Injection technique: single-shot Needle Needle type: Pencan  Needle gauge: 24 G Needle length: 9 cm Assessment Sensory level: T10 Additional Notes Functioning IV was confirmed and monitors were applied. Sterile prep and drape, including hand hygiene and sterile gloves were used. The patient was positioned and the spine was prepped. The skin was anesthetized with lidocaine.  Free flow of clear CSF was obtained prior to injecting local anesthetic into the CSF.  The spinal needle aspirated freely following injection.  The needle was carefully withdrawn.  The patient tolerated the procedure well.

## 2018-09-28 NOTE — Op Note (Signed)
OPERATIVE REPORT- TOTAL HIP ARTHROPLASTY   PREOPERATIVE DIAGNOSIS: Osteoarthritis of the Left hip.   POSTOPERATIVE DIAGNOSIS: Osteoarthritis of the Left  hip.   PROCEDURE: Left total hip arthroplasty, anterior approach.   SURGEON: Gaynelle Arabian, MD   ASSISTANT: Theresa Duty, PA-C  ANESTHESIA:  Spinal  ESTIMATED BLOOD LOSS:-200 mL    DRAINS: Hemovac x1.   COMPLICATIONS: None   CONDITION: PACU - hemodynamically stable.   BRIEF CLINICAL NOTE: Rachel Fleming is a 62 y.o. female who has advanced end-  stage arthritis of their Left  hip with progressively worsening pain and  dysfunction.The patient has failed nonoperative management and presents for  total hip arthroplasty.   PROCEDURE IN DETAIL: After successful administration of spinal  anesthetic, the traction boots for the [facility] bed were placed on both  feet and the patient was placed onto the [facility] bed, boots placed into the leg  holders. The Left hip was then isolated from the perineum with plastic  drapes and prepped and draped in the usual sterile fashion. ASIS and  greater trochanter were marked and a oblique incision was made, starting  at about 1 cm lateral and 2 cm distal to the ASIS and coursing towards  the anterior cortex of the femur. The skin was cut with a 10 blade  through subcutaneous tissue to the level of the fascia overlying the  tensor fascia lata muscle. The fascia was then incised in line with the  incision at the junction of the anterior third and posterior 2/3rd. The  muscle was teased off the fascia and then the interval between the TFL  and the rectus was developed. The Hohmann retractor was then placed at  the top of the femoral neck over the capsule. The vessels overlying the  capsule were cauterized and the fat on top of the capsule was removed.  A Hohmann retractor was then placed anterior underneath the rectus  femoris to give exposure to the entire anterior capsule. A T-shaped   capsulotomy was performed. The edges were tagged and the femoral head  was identified.       Osteophytes are removed off the superior acetabulum.  The femoral neck was then cut in situ with an oscillating saw. Traction  was then applied to the left lower extremity utilizing the [facility]  traction. The femoral head was then removed. Retractors were placed  around the acetabulum and then circumferential removal of the labrum was  performed. Osteophytes were also removed. Reaming starts at 45 mm to  medialize and  Increased in 2 mm increments to 47 mm. We reamed in  approximately 40 degrees of abduction, 20 degrees anteversion. A 48 mm  pinnacle acetabular shell was then impacted in anatomic position under  fluoroscopic guidance with excellent purchase. We did not need to place  any additional dome screws. A 28 mm neutral + 4 marathon liner was then  placed into the acetabular shell.       The femoral lift was then placed along the lateral aspect of the femur  just distal to the vastus ridge. The leg was  externally rotated and capsule  was stripped off the inferior aspect of the femoral neck down to the  level of the lesser trochanter, this was done with electrocautery. The femur was lifted after this was performed. The  leg was then placed in an extended and adducted position essentially delivering the femur. We also removed the capsule superiorly and the piriformis from the piriformis  fossa to gain excellent exposure of the  proximal femur. Rongeur was used to remove some cancellous bone to get  into the lateral portion of the proximal femur for placement of the  initial starter reamer. The starter broaches was placed  the starter broach  and was shown to go down the center of the canal. Broaching  with the Actis system was then performed starting at size 0  coursing  Up to size 3. A size 3 had excellent torsional and rotational  and axial stability. The trial high offset neck was then placed   with a 28 + 1.5 trial head. The hip was then reduced. We confirmed that  the stem was in the canal both on AP and lateral x-rays. It also has excellent sizing. The hip was reduced with outstanding stability through full extension and full external rotation.. AP pelvis was taken and the leg lengths were measured and found to be equal. Hip was then dislocated again and the femoral head and neck removed. The  femoral broach was removed. Size 3 Actis stem with a high offset  neck was then impacted into the femur following native anteversion. Has  excellent purchase in the canal. Excellent torsional and rotational and  axial stability. It is confirmed to be in the canal on AP and lateral  fluoroscopic views. The 28 + 1.5 ceramic head was placed and the hip  reduced with outstanding stability. Again AP pelvis was taken and it  confirmed that the leg lengths were equal. The wound was then copiously  irrigated with saline solution and the capsule reattached and repaired  with Ethibond suture. 30 ml of .25% Bupivicaine was  injected into the capsule and into the edge of the tensor fascia lata as well as subcutaneous tissue. The fascia overlying the tensor fascia lata was then closed with a running #1 V-Loc. Subcu was closed with interrupted 2-0 Vicryl and subcuticular running 4-0 Monocryl. Incision was cleaned  and dried. Steri-Strips and a bulky sterile dressing applied. Hemovac  drain was hooked to suction and then the patient was awakened and transported to  recovery in stable condition.        Please note that a surgical assistant was a medical necessity for this procedure to perform it in a safe and expeditious manner. Assistant was necessary to provide appropriate retraction of vital neurovascular structures and to prevent femoral fracture and allow for anatomic placement of the prosthesis.  Gaynelle Arabian, M.D.

## 2018-09-28 NOTE — Progress Notes (Signed)
PT Cancellation Note  Patient Details Name: Rachel Fleming MRN: [id number] DOB: 10-Oct-1956   Cancelled Treatment:    Reason Eval/Treat Not Completed: Patient not medically ready;Fatigue/lethargy limiting ability to participate - PT arrived to pt room at [place], and pt reported feeling numb in gluteal and hip regions. Pt also lethargic, deferring PT until tomorrow. PT to see pt tomorrow morning.   Julien Girt, PT Acute Rehabilitation Services Pager [phone]  Office [phone]    [place] 09/28/2018, 7:16 PM

## 2018-09-28 NOTE — Transfer of Care (Signed)
Immediate Anesthesia Transfer of Care Note  Patient: Rachel Fleming  Procedure(s) Performed: LEFT TOTAL HIP ARTHROPLASTY ANTERIOR APPROACH (Left Hip)  Patient Location: PACU  Anesthesia Type:MAC and Spinal  Level of Consciousness: awake, alert  and patient cooperative  Airway & Oxygen Therapy: Patient Spontanous Breathing and Patient connected to face mask oxygen  Post-op Assessment: Report given to RN and Post -op Vital signs reviewed and stable  Post vital signs: Reviewed and stable  Last Vitals:  Vitals Value Taken Time  BP 118/68 09/28/2018  3:00 PM  Temp    Pulse 93 09/28/2018  3:03 PM  Resp 15 09/28/2018  3:03 PM  SpO2 100 % 09/28/2018  3:03 PM  Vitals shown include unvalidated device data.  Last Pain:  Vitals:   09/28/18 1013  TempSrc:   PainSc: 2       Patients Stated Pain Goal: 3 (35/59/74 1638)  Complications: No apparent anesthesia complications

## 2018-09-28 NOTE — Anesthesia Procedure Notes (Signed)
Procedure Name: MAC Date/Time: 09/28/2018 1:06 PM Performed by: West Pugh, CRNA Pre-anesthesia Checklist: Patient identified, Emergency Drugs available, Suction available, Patient being monitored and Timeout performed Patient Re-evaluated:Patient Re-evaluated prior to induction Oxygen Delivery Method: Simple face mask Preoxygenation: Pre-oxygenation with 100% oxygen Induction Type: IV induction Placement Confirmation: positive ETCO2 Dental Injury: Teeth and Oropharynx as per pre-operative assessment

## 2018-09-28 NOTE — Discharge Instructions (Signed)
°Dr. Frank Aluisio °Total Joint Specialist °[facility] °[address] °[place], [place] [address] °[phone] ° °ANTERIOR APPROACH TOTAL HIP REPLACEMENT POSTOPERATIVE DIRECTIONS ° ° °Hip Rehabilitation, Guidelines Following Surgery  °The results of a hip operation are greatly improved after range of motion and muscle strengthening exercises. Follow all safety measures which are given to protect your hip. If any of these exercises cause increased pain or swelling in your joint, decrease the amount until you are comfortable again. Then slowly increase the exercises. Call your caregiver if you have problems or questions.  ° °HOME CARE INSTRUCTIONS  °• Remove items at home which could result in a fall. This includes throw rugs or furniture in walking pathways.  °· ICE to the affected hip every three hours for 30 minutes at a time and then as needed for pain and swelling.  Continue to use ice on the hip for pain and swelling from surgery. You may notice swelling that will progress down to the foot and ankle.  This is normal after surgery.  Elevate the leg when you are not up walking on it.   °· Continue to use the breathing machine which will help keep your temperature down.  It is common for your temperature to cycle up and down following surgery, especially at night when you are not up moving around and exerting yourself.  The breathing machine keeps your lungs expanded and your temperature down. ° °DIET °You may resume your previous home diet once your are discharged from the hospital. ° °DRESSING / WOUND CARE / SHOWERING °You may shower 3 days after surgery, but keep the wounds dry during showering.  You may use an occlusive plastic wrap (Press'n Seal for example), NO SOAKING/SUBMERGING IN THE BATHTUB.  If the bandage gets wet, change with a clean dry gauze.  If the incision gets wet, pat the wound dry with a clean towel. °You may start showering once you are discharged home but do not submerge the  incision under water. Just pat the incision dry and apply a dry gauze dressing on daily. °Change the surgical dressing daily and reapply a dry dressing each time. ° °ACTIVITY °Walk with your walker as instructed. °Use walker as long as suggested by your caregivers. °Avoid periods of inactivity such as sitting longer than an hour when not asleep. This helps prevent blood clots.  °You may resume a sexual relationship in one month or when given the OK by your doctor.  °You may return to work once you are cleared by your doctor.  °Do not drive a car for 6 weeks or until released by you surgeon.  °Do not drive while taking narcotics. ° °WEIGHT BEARING °Weight bearing as tolerated with assist device (walker, cane, etc) as directed, use it as long as suggested by your surgeon or therapist, typically at least 4-6 weeks. ° °POSTOPERATIVE CONSTIPATION PROTOCOL °Constipation - defined medically as fewer than three stools per week and severe constipation as less than one stool per week. ° °One of the most common issues patients have following surgery is constipation.  Even if you have a regular bowel pattern at home, your normal regimen is likely to be disrupted due to multiple reasons following surgery.  Combination of anesthesia, postoperative narcotics, change in appetite and fluid intake all can affect your bowels.  In order to avoid complications following surgery, here are some recommendations in order to help you during your recovery period. ° °Colace (docusate) - Pick up an over-the-counter form   of Colace or another stool softener and take twice a day as long as you are requiring postoperative pain medications.  Take with a full glass of water daily.  If you experience loose stools or diarrhea, hold the colace until you stool forms back up.  If your symptoms do not get better within 1 week or if they get worse, check with your doctor. ° °Dulcolax (bisacodyl) - Pick up over-the-counter and take as directed by the product  packaging as needed to assist with the movement of your bowels.  Take with a full glass of water.  Use this product as needed if not relieved by Colace only.  ° °MiraLax (polyethylene glycol) - Pick up over-the-counter to have on hand.  MiraLax is a solution that will increase the amount of water in your bowels to assist with bowel movements.  Take as directed and can mix with a glass of water, juice, soda, coffee, or tea.  Take if you go more than two days without a movement. °Do not use MiraLax more than once per day. Call your doctor if you are still constipated or irregular after using this medication for 7 days in a row. ° °If you continue to have problems with postoperative constipation, please contact the office for further assistance and recommendations.  If you experience "the worst abdominal pain ever" or develop nausea or vomiting, please contact the office immediatly for further recommendations for treatment. ° °ITCHING ° If you experience itching with your medications, try taking only a single pain pill, or even half a pain pill at a time.  You can also use Benadryl over the counter for itching or also to help with sleep.  ° °TED HOSE STOCKINGS °Wear the elastic stockings on both legs for three weeks following surgery during the day but you may remove then at night for sleeping. ° °MEDICATIONS °See your medication summary on the “After Visit Summary” that the nursing staff will review with you prior to discharge.  You may have some home medications which will be placed on hold until you complete the course of blood thinner medication.  It is important for you to complete the blood thinner medication as prescribed by your surgeon.  Continue your approved medications as instructed at time of discharge. ° °PRECAUTIONS °If you experience chest pain or shortness of breath - call 911 immediately for transfer to the hospital emergency department.  °If you develop a fever greater that 101 F, purulent drainage  from wound, increased redness or drainage from wound, foul odor from the wound/dressing, or calf pain - CONTACT YOUR SURGEON.   °                                                °FOLLOW-UP APPOINTMENTS °Make sure you keep all of your appointments after your operation with your surgeon and caregivers. You should call the office at the above phone number and make an appointment for approximately two weeks after the date of your surgery or on the date instructed by your surgeon outlined in the "After Visit Summary". ° °RANGE OF MOTION AND STRENGTHENING EXERCISES  °These exercises are designed to help you keep full movement of your hip joint. Follow your caregiver's or physical therapist's instructions. Perform all exercises about fifteen times, three times per day or as directed. Exercise both hips, even if you have   had only one joint replacement. These exercises can be done on a training (exercise) mat, on the floor, on a table or on a bed. Use whatever works the best and is most comfortable for you. Use music or television while you are exercising so that the exercises are a pleasant break in your day. This will make your life better with the exercises acting as a break in routine you can look forward to.  °• Lying on your back, slowly slide your foot toward your buttocks, raising your knee up off the floor. Then slowly slide your foot back down until your leg is straight again.  °• Lying on your back spread your legs as far apart as you can without causing discomfort.  °• Lying on your side, raise your upper leg and foot straight up from the floor as far as is comfortable. Slowly lower the leg and repeat.  °• Lying on your back, tighten up the muscle in the front of your thigh (quadriceps muscles). You can do this by keeping your leg straight and trying to raise your heel off the floor. This helps strengthen the largest muscle supporting your knee.  °• Lying on your back, tighten up the muscles of your buttocks both  with the legs straight and with the knee bent at a comfortable angle while keeping your heel on the floor.  ° °IF YOU ARE TRANSFERRED TO A SKILLED REHAB FACILITY °If the patient is transferred to a skilled rehab facility following release from the hospital, a list of the current medications will be sent to the facility for the patient to continue.  When discharged from the skilled rehab facility, please have the facility set up the patient's Home Health Physical Therapy prior to being released. Also, the skilled facility will be responsible for providing the patient with their medications at time of release from the facility to include their pain medication, the muscle relaxants, and their blood thinner medication. If the patient is still at the rehab facility at time of the two week follow up appointment, the skilled rehab facility will also need to assist the patient in arranging follow up appointment in our office and any transportation needs. ° °MAKE SURE YOU:  °• Understand these instructions.  °• Get help right away if you are not doing well or get worse.  ° ° °Pick up stool softner and laxative for home use following surgery while on pain medications. °Do not submerge incision under water. °Please use good hand washing techniques while changing dressing each day. °May shower starting three days after surgery. °Please use a clean towel to pat the incision dry following showers. °Continue to use ice for pain and swelling after surgery. °Do not use any lotions or creams on the incision until instructed by your surgeon. ° °

## 2018-09-28 NOTE — Anesthesia Preprocedure Evaluation (Signed)
Anesthesia Evaluation  Patient identified by MRN, date of birth, ID band Patient awake    Reviewed: Allergy & Precautions, H&P , NPO status , Patient's Chart, lab work & pertinent test results  History of Anesthesia Complications (+) PONV and history of anesthetic complications  Airway Mallampati: II   Neck ROM: full    Dental   Pulmonary former smoker,    breath sounds clear to auscultation       Cardiovascular + Valvular Problems/Murmurs AI  Rhythm:regular Rate:Normal  Mild AI   Neuro/Psych    GI/Hepatic   Endo/Other    Renal/GU      Musculoskeletal  (+) Arthritis ,   Abdominal   Peds  Hematology   Anesthesia Other Findings   Reproductive/Obstetrics                             Anesthesia Physical Anesthesia Plan  ASA: II  Anesthesia Plan: Spinal   Post-op Pain Management:    Induction: Intravenous  PONV Risk Score and Plan: 3 and Ondansetron, Propofol infusion, Dexamethasone, Scopolamine patch - Pre-op and Treatment may vary due to age or medical condition  Airway Management Planned: Simple Face Mask  Additional Equipment:   Intra-op Plan:   Post-operative Plan:   Informed Consent: I have reviewed the patients History and Physical, chart, labs and discussed the procedure including the risks, benefits and alternatives for the proposed anesthesia with the patient or authorized representative who has indicated his/her understanding and acceptance.     Plan Discussed with: CRNA, Anesthesiologist and Surgeon  Anesthesia Plan Comments:         Anesthesia Quick Evaluation

## 2018-09-28 NOTE — Anesthesia Postprocedure Evaluation (Signed)
Anesthesia Post Note  Patient: UNDINE NEALIS  Procedure(s) Performed: LEFT TOTAL HIP ARTHROPLASTY ANTERIOR APPROACH (Left Hip)     Patient location during evaluation: PACU Anesthesia Type: Spinal Level of consciousness: oriented and awake and alert Pain management: pain level controlled Vital Signs Assessment: post-procedure vital signs reviewed and stable Respiratory status: spontaneous breathing, respiratory function stable and patient connected to nasal cannula oxygen Cardiovascular status: blood pressure returned to baseline and stable Postop Assessment: no headache, no backache and no apparent nausea or vomiting Anesthetic complications: no    Last Vitals:  Vitals:   09/28/18 1545 09/28/18 1600  BP: 130/69 131/80  Pulse: (!) 52 (!) 53  Resp: 12 13  Temp: (!) 36.4 C   SpO2: 100% 100%    Last Pain:  Vitals:   09/28/18 1600  TempSrc:   PainSc: [place]

## 2018-09-29 ENCOUNTER — Encounter (HOSPITAL_COMMUNITY): Payer: Self-pay | Admitting: Orthopedic Surgery

## 2018-09-29 LAB — BASIC METABOLIC PANEL
Anion gap: 6 (ref 5–15)
BUN: 13 mg/dL (ref 8–23)
CALCIUM: 8.7 mg/dL — AB (ref 8.9–10.3)
CO2: 27 mmol/L (ref 22–32)
CREATININE: 0.44 mg/dL (ref 0.44–1.00)
Chloride: 106 mmol/L (ref 98–111)
GFR calc Af Amer: >60 mL/min (ref >60)
GFR calc non Af Amer: >60 mL/min (ref >60)
GLUCOSE: 111 mg/dL — AB (ref 70–99)
POTASSIUM: 4.1 mmol/L (ref 3.5–5.1)
Sodium: 139 mmol/L (ref 135–145)

## 2018-09-29 LAB — CBC
HCT: 33.3 % — ABNORMAL LOW (ref 36.0–46.0)
Hemoglobin: 10.5 g/dL — ABNORMAL LOW (ref 12.0–15.0)
MCH: 30.7 pg (ref 26.0–34.0)
MCHC: 31.5 g/dL (ref 30.0–36.0)
MCV: 97.4 fL (ref 80.0–100.0)
PLATELETS: 279 10*3/uL (ref 150–400)
RBC: 3.42 MIL/uL — ABNORMAL LOW (ref 3.87–5.11)
RDW: 13.2 % (ref 11.5–15.5)
WBC: 12.3 10*3/uL — ABNORMAL HIGH (ref 4.0–10.5)
nRBC: 0 % (ref 0.0–0.2)

## 2018-09-29 MED ORDER — ASPIRIN 325 MG PO TBEC: 325.0000 mg | DELAYED_RELEASE_TABLET | Freq: Two times a day (BID) | ORAL | 0 refills | Status: AC

## 2018-09-29 MED ORDER — SODIUM CHLORIDE 0.9 % IV BOLUS
250.0000 mL | Freq: Once | INTRAVENOUS | Status: AC
  Administered 2018-09-29: 250 mL via INTRAVENOUS

## 2018-09-29 MED ORDER — TRAMADOL HCL 50 MG PO TABS: 50.0000 mg | ORAL_TABLET | Freq: Four times a day (QID) | ORAL | 0 refills | Status: DC | PRN

## 2018-09-29 MED ORDER — TRAMADOL HCL 50 MG PO TABS
50.0000 mg | ORAL_TABLET | Freq: Four times a day (QID) | ORAL | Status: DC | PRN
  Administered 2018-09-29: 100 mg via ORAL
  Filled 2018-09-29: qty 2

## 2018-09-29 MED ORDER — METHOCARBAMOL 500 MG PO TABS: 500.0000 mg | ORAL_TABLET | Freq: Four times a day (QID) | ORAL | 0 refills | Status: DC | PRN

## 2018-09-29 NOTE — Evaluation (Signed)
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  Pt will benefit from skilled PT to increase their independence and safety with mobility to allow discharge to the venue listed below.  Pt assisted with ambulating in hallway and performed LE exercises.  Pt's spouse to come later today.  Will practice steps prior to d/c.  Pt anticipates home later today.     Follow Up Recommendations Follow surgeon's recommendation for DC plan and follow-up therapies(plan for no f/u, HEP)    Equipment Recommendations  None recommended by PT    Recommendations for Other Services       Precautions / Restrictions Precautions Precautions: Fall Restrictions Other Position/Activity Restrictions: WBAT      Mobility  Bed Mobility               General bed mobility comments: pt up in bathroom with RN on arrival  Transfers Overall transfer level: Needs assistance Equipment used: Rolling walker (2 wheeled) Transfers: Sit to/from Stand Sit to Stand: Min guard         General transfer comment: verbal cues for hand placement  Ambulation/Gait Ambulation/Gait assistance: Min guard Gait Distance (Feet): 280 Feet Assistive device: Rolling walker (2 wheeled) Gait Pattern/deviations: Step-through pattern;Decreased stride length;Antalgic     General Gait Details: verbal cues for sequence, RW positioning, step length, pt tolerated good distance  Stairs            Wheelchair Mobility    Modified Rankin (Stroke Patients Only)       Balance                                             Pertinent Vitals/Pain Pain Assessment: 0-10 Pain Score: 2  Pain Location: left hip Pain Descriptors / Indicators:  Aching;Sore;Tightness Pain Intervention(s): Limited activity within patient's tolerance;Repositioned;Monitored during session;Ice applied    Home Living Family/patient expects to be discharged to:: Private residence Living Arrangements: Spouse/significant other Available Help at Discharge: Family Type of Home: House Home Access: Stairs to enter Entrance Stairs-Rails: None Entrance Stairs-Number of Steps: 2 Home Layout: One level Home Equipment: Environmental consultant - 2 wheels;Bedside commode;Cane - single point      Prior Function Level of Independence: Independent               Hand Dominance        Extremity/Trunk Assessment        Lower Extremity Assessment Lower Extremity Assessment: LLE deficits/detail LLE Deficits / Details: anticipated post op hip weakness, hip grossly at least 2+/5; able to perform ankle pumps       Communication   Communication: No difficulties  Cognition Arousal/Alertness: Awake/alert Behavior During Therapy: WFL for tasks assessed/performed Overall Cognitive Status: Within Functional Limits for tasks assessed                                        General Comments      Exercises Total Joint Exercises Ankle Circles/Pumps: AROM;Both;10 reps Quad Sets: AROM;10 reps;Both Hip ABduction/ADduction: AROM;10 reps;Seated;Standing;Left Long  Arc Quad: AROM;10 reps;Left;Seated Knee Flexion: AROM;10 reps;Left;Standing Marching in Standing: AROM;10 reps;Left;Standing Standing Hip Extension: Standing;Left;AROM;10 reps   Assessment/Plan    PT Assessment    PT Problem List         PT Treatment Interventions      PT Goals (Current goals can be found in the Care Plan section)  Acute Rehab PT Goals PT Goal Formulation: With patient Time For Goal Achievement: 10/03/18 Potential to Achieve Goals: Good    Frequency 7X/week   Barriers to discharge        Co-evaluation               AM-PAC PT "6 Clicks" Daily Activity   Outcome Measure Difficulty turning over in bed (including adjusting bedclothes, sheets and blankets)?: A Little Difficulty moving from lying on back to sitting on the side of the bed? : A Little Difficulty sitting down on and standing up from a chair with arms (e.g., wheelchair, bedside commode, etc,.)?: A Little Help needed moving to and from a bed to chair (including a wheelchair)?: A Little Help needed walking in hospital room?: A Little Help needed climbing 3-5 steps with a railing? : A Little 6 Click Score: 18    End of Session Equipment Utilized During Treatment: Gait belt Activity Tolerance: Patient tolerated treatment well Patient left: in chair;with call bell/phone within reach;with chair alarm set Nurse Communication: Mobility status PT Visit Diagnosis: Other abnormalities of gait and mobility (R26.89)    Time: 1001-1018 PT Time Calculation (min) (ACUTE ONLY): 17 min   Charges:   PT Evaluation $PT Eval Low Complexity: [place], PT, DPT Acute Rehabilitation Services Office: [phone] Pager: [phone]  Trena Platt 09/29/2018, 12:28 PM

## 2018-09-29 NOTE — Care Management Note (Signed)
Case Management Note  Patient Details  Name: Rachel Fleming MRN: [id number] Date of Birth: Aug 02, 1956  Subjective/Objective:          Spoke with patient at bedside. Confirmed plan for HEP, already arranged. Has RW and 3n1. [phone]          Action/Plan:   Expected Discharge Date:  09/29/18               Expected Discharge Plan:  Home/Self Care  In-House Referral:  NA  Discharge planning Services  CM Consult, NA  Post Acute Care Choice:  NA Choice offered to:  Patient  DME Arranged:  N/A DME Agency:  NA  HH Arranged:  NA HH Agency:  NA  Status of Service:  Completed, signed off  If discussed at [place] of Stay Meetings, dates discussed:    Additional Comments:  Guadalupe Maple, RN 09/29/2018, 11:14 AM

## 2018-09-29 NOTE — Progress Notes (Signed)
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  Pt performed steps and feels comfortable with her ability to perform once home.  Pt declined HEP handout stating she still has handouts from acute PT and HHPT from previous THA.  Pt ready for d/c home today.   Follow Up Recommendations  Follow surgeon's recommendation for DC plan and follow-up therapies(no f/u PT)     Equipment Recommendations  None recommended by PT    Recommendations for Other Services       Precautions / Restrictions Precautions Precautions: Fall Restrictions Other Position/Activity Restrictions: WBAT    Mobility  Bed Mobility               General bed mobility comments: pt up in recliner on arrival  Transfers Overall transfer level: Needs assistance Equipment used: Rolling walker (2 wheeled) Transfers: Sit to/from Stand Sit to Stand: Supervision         General transfer comment: verbal cues for hand placement and safety  Ambulation/Gait Ambulation/Gait assistance: Min guard;Supervision Gait Distance (Feet): 400 Feet Assistive device: Rolling walker (2 wheeled) Gait Pattern/deviations: Step-through pattern;Decreased stride length;Antalgic     General Gait Details: verbal cues for RW positioning, step length, pt reports stiffness and happy to perform more distance   Stairs Stairs: Yes Stairs assistance: Min guard Stair Management: Step to pattern;Forwards;One rail Left Number of Stairs: 2 General stair comments: verbal cues for sequence, pt has L hand rail and post on right, therapist provided a hand for R UE support (spouse available to do this at home), pt easily performed steps with step to pattern and feels comfortable with performance   Wheelchair  Mobility    Modified Rankin (Stroke Patients Only)       Balance                                            Cognition Arousal/Alertness: Awake/alert Behavior During Therapy: WFL for tasks assessed/performed Overall Cognitive Status: Within Functional Limits for tasks assessed                                        Exercises     General Comments        Pertinent Vitals/Pain Pain Assessment: 0-10 Pain Score: 4  Pain Location: left hip Pain Descriptors / Indicators: Aching;Sore;Tightness Pain Intervention(s): Limited activity within patient's tolerance;Monitored during session;Repositioned    Home Living Family/patient expects to be discharged to:: Private residence Living Arrangements: Spouse/significant other Available Help at Discharge: Family Type of Home: House Home Access: Stairs to enter Entrance Stairs-Rails: None Home Layout: One level Home Equipment: Environmental consultant - 2 wheels;Bedside commode;Cane - single point      Prior Function Level of Independence: Independent          PT Goals (current goals can now be found in the care plan section) Acute Rehab PT Goals PT Goal Formulation: With patient Time For Goal Achievement: 10/03/18 Potential to Achieve Goals: Good Progress towards PT goals: Progressing toward goals    Frequency  7X/week      PT Plan Current plan remains appropriate    Co-evaluation              AM-PAC PT "6 Clicks" Daily Activity  Outcome Measure  Difficulty turning over in bed (including adjusting bedclothes, sheets and blankets)?: A Little Difficulty moving from lying on back to sitting on the side of the bed? : A Little Difficulty sitting down on and standing up from a chair with arms (e.g., wheelchair, bedside commode, etc,.)?: A Little Help needed moving to and from a bed to chair (including a wheelchair)?: A Little Help needed walking in hospital room?: A Little Help needed climbing  3-5 steps with a railing? : A Little 6 Click Score: 18    End of Session Equipment Utilized During Treatment: Gait belt Activity Tolerance: Patient tolerated treatment well Patient left: in chair;with call bell/phone within reach;with family/visitor present Nurse Communication: Mobility status PT Visit Diagnosis: Other abnormalities of gait and mobility (R26.89)     Time: 1400-1409 PT Time Calculation (min) (ACUTE ONLY): 9 min  Charges:  $Gait Training: 8-22 mins                     Carmelia Bake, PT, DPT Acute Rehabilitation Services Office: [phone] Pager: [phone]  Trena Platt 09/29/2018, 4:07 PM

## 2018-09-29 NOTE — Progress Notes (Signed)
   Subjective: 1 Day Post-Op Procedure(s) (LRB): LEFT TOTAL HIP ARTHROPLASTY ANTERIOR APPROACH (Left) Patient reports pain as mild.   Patient seen in rounds by Dr. Wynelle Link. Patient is well, and has had no acute complaints or problems. BP was low yesterday afternoon, 250 mL bolus ordered. Patient asymptomatic. Foley catheter removed this AM. Denies chest pain or SOB. We will start therapy today.   Objective: Vital signs in last 24 hours: Temp:  [96.4 F (35.8 C)-98.4 F (36.9 C)] 98.1 F (36.7 C) (11/05 0527) Pulse Rate:  [46-93] 69 (11/05 0527) Resp:  [12-18] 14 (11/04 1901) BP: (83-143)/(53-81) 106/56 (11/05 0527) SpO2:  [95 %-100 %] 97 % (11/05 0527) Weight:  [58.1 kg] 58.1 kg (11/04 1013)  Intake/Output from previous day:  Intake/Output Summary (Last 24 hours) at 09/29/2018 0744 Last data filed at 09/29/2018 0600 Gross per 24 hour  Intake 3714.56 ml  Output 2650 ml  Net 1064.56 ml    Labs: Recent Labs    09/29/18 0416  HGB 10.5*   Recent Labs    09/29/18 0416  WBC 12.3*  RBC 3.42*  HCT 33.3*  PLT 279   Recent Labs    09/29/18 0416  NA 139  K 4.1  CL 106  CO2 27  BUN 13  CREATININE 0.44  GLUCOSE 111*  CALCIUM 8.7*   Exam: General - Patient is Alert and Oriented Extremity - Neurologically intact Neurovascular intact Sensation intact distally Dorsiflexion/Plantar flexion intact Dressing - dressing C/D/I Motor Function - intact, moving foot and toes well on exam.   Past Medical History:  Diagnosis Date  . Aortic insufficiency    echo 7/19  . Aortic valve regurgitation 2019   Mild per ECHO 05/2018  . Arthritis    hands, hips, neck - otc med prn  . Dry eyes due to decreased tear production   . Heart murmur    never has caused any problems  . History of kidney stones 2017   passed stone no surgery required  . PONV (postoperative nausea and vomiting)    scop patch, zofran & decadron works great for patient - see 07/09/18 anesthesia event     Assessment/Plan: 1 Day Post-Op Procedure(s) (LRB): LEFT TOTAL HIP ARTHROPLASTY ANTERIOR APPROACH (Left) Active Problems:   OA (osteoarthritis) of hip  Estimated body mass index is 21.3 kg/m as calculated from the following:   Height as of this encounter: 5\' 5"  (1.651 m).   Weight as of this encounter: 58.1 kg. Advance diet Up with therapy D/C IV fluids  DVT Prophylaxis - Aspirin Weight bearing as tolerated. D/C O2 and pulse ox and try on room air. Hemovac pulled without difficulty, will begin therapy.  BP soft again this AM, a second 250 mL bolus ordered. Plan is to go Home after hospital stay. Plan for discharge today once meeting goals with therapy with [facility]. Follow-up in the office in 2 weeks with Dr. Wynelle Link.  Theresa Duty, PA-C Orthopedic Surgery 09/29/2018, 7:44 AM

## 2018-10-05 NOTE — Discharge Summary (Signed)
Physician Discharge Summary   Patient ID: Rachel Fleming MRN: [id number] DOB/AGE: [birth year] 63 y.o.  Admit date: 09/28/2018 Discharge date: 09/29/2018  Primary Diagnosis: Osteoarthritis, left hip   Admission Diagnoses:  Past Medical History:  Diagnosis Date  . Aortic insufficiency    echo 7/19  . Aortic valve regurgitation 2019   Mild per ECHO 05/2018  . Arthritis    hands, hips, neck - otc med prn  . Dry eyes due to decreased tear production   . Heart murmur    never has caused any problems  . History of kidney stones 2017   passed stone no surgery required  . PONV (postoperative nausea and vomiting)    scop patch, zofran & decadron works great for patient - see 07/09/18 anesthesia event   Discharge Diagnoses:   Active Problems:   OA (osteoarthritis) of hip  Estimated body mass index is 21.3 kg/m as calculated from the following:   Height as of this encounter: _0  (1.651 m).   Weight as of this encounter: 58.1 kg.  Procedure:  Procedure(s) (LRB): LEFT TOTAL HIP ARTHROPLASTY ANTERIOR APPROACH (Left)   Consults: None  HPI: Rachel Fleming is a 62 y.o. female who has advanced end-stage arthritis of their Left  hip with progressively worsening pain and dysfunction.The patient has failed nonoperative management and presents for total hip arthroplasty.   Laboratory Data: Admission on 09/28/2018, Discharged on 09/29/2018  Component Date Value Ref Range Status  . WBC 09/29/2018 12.3* 4.0 - 10.5 K/uL Final  . RBC 09/29/2018 3.42* 3.87 - 5.11 MIL/uL Final  . Hemoglobin 09/29/2018 10.5* 12.0 - 15.0 g/dL Final  . HCT 09/29/2018 33.3* 36.0 - 46.0 % Final  . MCV 09/29/2018 97.4  80.0 - 100.0 fL Final  . MCH 09/29/2018 30.7  26.0 - 34.0 pg Final  . MCHC 09/29/2018 31.5  30.0 - 36.0 g/dL Final  . RDW 09/29/2018 13.2  11.5 - 15.5 % Final  . Platelets 09/29/2018 279  150 - 400 K/uL Final  . nRBC 09/29/2018 0.0  0.0 - 0.2 % Final   Performed at [facility], [place] [address], [place], [place] [address]  . Sodium 09/29/2018 139  135 - 145 mmol/L Final  . Potassium 09/29/2018 4.1  3.5 - 5.1 mmol/L Final  . Chloride 09/29/2018 106  98 - 111 mmol/L Final  . CO2 09/29/2018 27  22 - 32 mmol/L Final  . Glucose, Bld 09/29/2018 111* 70 - 99 mg/dL Final  . BUN 09/29/2018 13  8 - 23 mg/dL Final  . Creatinine, Ser 09/29/2018 0.44  0.44 - 1.00 mg/dL Final  . Calcium 09/29/2018 8.7* 8.9 - 10.3 mg/dL Final  . GFR calc non Af Amer 09/29/2018 >60  >60 mL/min Final  . GFR calc Af Amer 09/29/2018 >60  >60 mL/min Final   Comment: (NOTE) The eGFR has been calculated using the CKD EPI equation. This calculation has not been validated in all clinical situations. eGFR's persistently <60 mL/min signify possible Chronic Kidney Disease.   Georgiann Hahn gap 09/29/2018 6  5 - 15 Final   Performed at [facility], [place] [address], [place], [place] [address]  Hospital Outpatient Visit on 09/21/2018  Component Date Value Ref Range Status  . aPTT 09/21/2018 29  24 - 36 seconds Final   Performed at [facility], [place] [address] [place], [place] [address]  . WBC 09/21/2018 6.6  4.0 - 10.5 K/uL Final  . RBC 09/21/2018  4.01  3.87 - 5.11 MIL/uL Final  . Hemoglobin 09/21/2018 12.3  12.0 - 15.0 g/dL Final  . HCT 09/21/2018 38.6  36.0 - 46.0 % Final  . MCV 09/21/2018 96.3  80.0 - 100.0 fL Final  . MCH 09/21/2018 30.7  26.0 - 34.0 pg Final  . MCHC 09/21/2018 31.9  30.0 - 36.0 g/dL Final  . RDW 09/21/2018 13.1  11.5 - 15.5 % Final  . Platelets 09/21/2018 320  150 - 400 K/uL Final  . nRBC 09/21/2018 0.0  0.0 - 0.2 % Final   Performed at [facility], [place] [address], [place], [place] [address]  . Sodium 09/21/2018 140  135 - 145 mmol/L Final  . Potassium 09/21/2018 4.2  3.5 - 5.1 mmol/L Final  . Chloride 09/21/2018 107  98 - 111 mmol/L Final  . CO2 09/21/2018 27  22 - 32 mmol/L Final  . Glucose, Bld 09/21/2018 90  70 - 99 mg/dL Final   . BUN 09/21/2018 28* 8 - 23 mg/dL Final  . Creatinine, Ser 09/21/2018 0.50  0.44 - 1.00 mg/dL Final  . Calcium 09/21/2018 9.4  8.9 - 10.3 mg/dL Final  . Total Protein 09/21/2018 6.9  6.5 - 8.1 g/dL Final  . Albumin 09/21/2018 4.1  3.5 - 5.0 g/dL Final  . AST 09/21/2018 22  15 - 41 U/L Final  . ALT 09/21/2018 16  0 - 44 U/L Final  . Alkaline Phosphatase 09/21/2018 81  38 - 126 U/L Final  . Total Bilirubin 09/21/2018 1.1  0.3 - 1.2 mg/dL Final  . GFR calc non Af Amer 09/21/2018 >60  >60 mL/min Final  . GFR calc Af Amer 09/21/2018 >60  >60 mL/min Final   Comment: (NOTE) The eGFR has been calculated using the CKD EPI equation. This calculation has not been validated in all clinical situations. eGFR's persistently <60 mL/min signify possible Chronic Kidney Disease.   Georgiann Hahn gap 09/21/2018 6  5 - 15 Final   Performed at [facility], [place] [address] [place], [place] [address]  . Prothrombin Time 09/21/2018 12.3  11.4 - 15.2 seconds Final  . INR 09/21/2018 0.92   Final   Performed at [facility], [place] [address] [place], [place] [address]  . ABO/RH(D) 09/21/2018 O POS   Final  . Antibody Screen 09/21/2018 NEG   Final  . Sample Expiration 09/21/2018 10/01/2018   Final  . Extend sample reason 09/21/2018    Final                   Value:NO TRANSFUSIONS OR PREGNANCY IN THE PAST 3 MONTHS Performed at [facility], [place] [address], [place], [place] [address]   . MRSA, PCR 09/21/2018 NEGATIVE  NEGATIVE Final  . Staphylococcus aureus 09/21/2018 NEGATIVE  NEGATIVE Final   Comment: (NOTE) The Xpert SA Assay (FDA approved for NASAL specimens in patients 59 years of age and older), is one component of a comprehensive surveillance program. It is not intended to diagnose infection nor to guide or monitor treatment. Performed at [facility], [place] [address] [place], [place] [address]   Hospital Outpatient Visit on  08/05/2018  Component Date Value Ref Range Status  . WBC 08/05/2018 6.0  4.0 - 10.5 K/uL Final  . RBC 08/05/2018 4.22  3.87 - 5.11 MIL/uL Final  . Hemoglobin 08/05/2018 13.3  12.0 - 15.0 g/dL Final  . HCT 08/05/2018 40.2  36.0 - 46.0 % Final  .  MCV 08/05/2018 95.3  78.0 - 100.0 fL Final  . MCH 08/05/2018 31.5  26.0 - 34.0 pg Final  . MCHC 08/05/2018 33.1  30.0 - 36.0 g/dL Final  . RDW 08/05/2018 13.4  11.5 - 15.5 % Final  . Platelets 08/05/2018 320  150 - 400 K/uL Final   Performed at [facility], [address] [place], [place] [address]     X-Rays:Dg Pelvis Portable  Result Date: 09/28/2018 CLINICAL DATA:  Post left total hip arthroplasty. EXAM: PORTABLE PELVIS 1-2 VIEWS COMPARISON:  12/03/2017 FINDINGS: Examination demonstrates no change in patient's right total hip arthroplasty. Evidence of recent left total hip arthroplasty with prosthesis intact and normally located. Surgical drain over the soft tissues of the lateral left hip. Remainder the exam is unchanged. IMPRESSION: Expected changes post left total hip arthroplasty. Electronically Signed   By: Marin Olp M.D.   On: 09/28/2018 15:28   Dg C-arm 1-60 Min-no Report  Result Date: 09/28/2018 Fluoroscopy was utilized by the requesting physician.  No radiographic interpretation.   Dg Hip Operative Unilat With Pelvis Left  Result Date: 09/28/2018 CLINICAL DATA:  Status post left total hip joint prosthesis placement. EXAM: OPERATIVE left HIP (WITH PELVIS IF PERFORMED) 1 VIEWS TECHNIQUE: Fluoroscopic spot image(s) were submitted for interpretation post-operatively. COMPARISON:  AP pelvis of December 03, 2017 FINDINGS: There is a pre-existing right hip joint prosthesis. On the left partially imaged is a prosthetic left hip joint. The femoral component is not included in the field of view. Reported fluoro time is 15.8 seconds with a dose of 167 mGy. IMPRESSION: Limited view of the prosthetic left hip joint. The acetabular component is  included almost in its entirety and is grossly normal but the femoral component is not included. Electronically Signed   By: David  Martinique M.D.   On: 09/28/2018 14:38    EKG: Orders placed or performed in visit on 05/15/18  . EKG 12-Lead     Hospital Course: Rachel Fleming is a 62 y.o. who was admitted to [facility]. They were brought to the operating room on 09/28/2018 and underwent Procedure(s): LEFT TOTAL HIP ARTHROPLASTY ANTERIOR APPROACH.  Patient tolerated the procedure well and was later transferred to the recovery room and then to the orthopaedic floor for postoperative care. They were given PO and IV analgesics for pain control following their surgery. They were given 24 hours of postoperative antibiotics of  Anti-infectives (From admission, onward)   Start     Dose/Rate Route Frequency Ordered Stop   09/28/18 1930  ceFAZolin (ANCEF) IVPB 1 g/50 mL premix     1 g 100 mL/hr over 30 Minutes Intravenous Every 6 hours 09/28/18 1618 09/29/18 0238   09/28/18 1000  ceFAZolin (ANCEF) IVPB 2g/100 mL premix     2 g 200 mL/hr over 30 Minutes Intravenous On call to O.R. 09/28/18 [id number] 09/28/18 1347     and started on DVT prophylaxis in the form of Aspirin.   PT and OT were ordered for total joint protocol. Discharge planning consulted to help with postop disposition and equipment needs.  Patient had a decent night on the evening of surgery. They started to get up OOB with therapy on Pod #0. Blood pressure was noted to be low on the afternoon following surgery, 250 mL bolus ordered.Pt was seen during rounds and was ready to go home pending progress with therapy. Hemovac drain was pulled without difficulty. BP was still noted to be soft, so a second 250 mL bolus  was ordered. She worked with therapy on POD #1 and was meeting her goals. Pt was discharged to home later that day in stable condition.  Diet: Regular diet Activity: WBAT Follow-up: in 2 weeks with Dr. Wynelle Link Disposition: Home with  home exercise program Discharged Condition: stable   Discharge Instructions    Call MD / Call 911   Complete by:  As directed    If you experience chest pain or shortness of breath, CALL 911 and be transported to the hospital emergency room.  If you develope a fever above 101 F, pus (white drainage) or increased drainage or redness at the wound, or calf pain, call your surgeon's office.   Change dressing   Complete by:  As directed    Change the dressing daily with sterile 4 x 4 inch gauze dressing and paper tape.   Constipation Prevention   Complete by:  As directed    Drink plenty of fluids.  Prune juice may be helpful.  You may use a stool softener, such as Colace (over the counter) 100 mg twice a day.  Use MiraLax (over the counter) for constipation as needed.   Diet - low sodium heart healthy   Complete by:  As directed    Discharge instructions   Complete by:  As directed    Dr. Gaynelle Arabian Total Joint Specialist [facility] [address] [place], [place] [address] [phone]  ANTERIOR APPROACH TOTAL HIP REPLACEMENT POSTOPERATIVE DIRECTIONS   Hip Rehabilitation, Guidelines Following Surgery  The results of a hip operation are greatly improved after range of motion and muscle strengthening exercises. Follow all safety measures which are given to protect your hip. If any of these exercises cause increased pain or swelling in your joint, decrease the amount until you are comfortable again. Then slowly increase the exercises. Call your caregiver if you have problems or questions.   HOME CARE INSTRUCTIONS  Remove items at home which could result in a fall. This includes throw rugs or furniture in walking pathways.  ICE to the affected hip every three hours for 30 minutes at a time and then as needed for pain and swelling.  Continue to use ice on the hip for pain and swelling from surgery. You may notice swelling that will progress down to the foot and ankle.  This  is normal after surgery.  Elevate the leg when you are not up walking on it.   Continue to use the breathing machine which will help keep your temperature down.  It is common for your temperature to cycle up and down following surgery, especially at night when you are not up moving around and exerting yourself.  The breathing machine keeps your lungs expanded and your temperature down.  DIET You may resume your previous home diet once your are discharged from the hospital.  DRESSING / WOUND CARE / SHOWERING You may shower 3 days after surgery, but keep the wounds dry during showering.  You may use an occlusive plastic wrap (Press'n Seal for example), NO SOAKING/SUBMERGING IN THE BATHTUB.  If the bandage gets wet, change with a clean dry gauze.  If the incision gets wet, pat the wound dry with a clean towel. You may start showering once you are discharged home but do not submerge the incision under water. Just pat the incision dry and apply a dry gauze dressing on daily. Change the surgical dressing daily and reapply a dry dressing each time.  ACTIVITY Walk with your walker  as instructed. Use walker as long as suggested by your caregivers. Avoid periods of inactivity such as sitting longer than an hour when not asleep. This helps prevent blood clots.  You may resume a sexual relationship in one month or when given the OK by your doctor.  You may return to work once you are cleared by your doctor.  Do not drive a car for 6 weeks or until released by you surgeon.  Do not drive while taking narcotics.  WEIGHT BEARING Weight bearing as tolerated with assist device (walker, cane, etc) as directed, use it as long as suggested by your surgeon or therapist, typically at least 4-6 weeks.  POSTOPERATIVE CONSTIPATION PROTOCOL Constipation - defined medically as fewer than three stools per week and severe constipation as less than one stool per week.  One of the most common issues patients have  following surgery is constipation.  Even if you have a regular bowel pattern at home, your normal regimen is likely to be disrupted due to multiple reasons following surgery.  Combination of anesthesia, postoperative narcotics, change in appetite and fluid intake all can affect your bowels.  In order to avoid complications following surgery, here are some recommendations in order to help you during your recovery period.  Colace (docusate) - Pick up an over-the-counter form of Colace or another stool softener and take twice a day as long as you are requiring postoperative pain medications.  Take with a full glass of water daily.  If you experience loose stools or diarrhea, hold the colace until you stool forms back up.  If your symptoms do not get better within 1 week or if they get worse, check with your doctor.  Dulcolax (bisacodyl) - Pick up over-the-counter and take as directed by the product packaging as needed to assist with the movement of your bowels.  Take with a full glass of water.  Use this product as needed if not relieved by Colace only.   MiraLax (polyethylene glycol) - Pick up over-the-counter to have on hand.  MiraLax is a solution that will increase the amount of water in your bowels to assist with bowel movements.  Take as directed and can mix with a glass of water, juice, soda, coffee, or tea.  Take if you go more than two days without a movement. Do not use MiraLax more than once per day. Call your doctor if you are still constipated or irregular after using this medication for 7 days in a row.  If you continue to have problems with postoperative constipation, please contact the office for further assistance and recommendations.  If you experience "the worst abdominal pain ever" or develop nausea or vomiting, please contact the office immediatly for further recommendations for treatment.  ITCHING  If you experience itching with your medications, try taking only a single pain pill, or  even half a pain pill at a time.  You can also use Benadryl over the counter for itching or also to help with sleep.   TED HOSE STOCKINGS Wear the elastic stockings on both legs for three weeks following surgery during the day but you may remove then at night for sleeping.  MEDICATIONS See your medication summary on the "After Visit Summary" that the nursing staff will review with you prior to discharge.  You may have some home medications which will be placed on hold until you complete the course of blood thinner medication.  It is important for you to complete the blood thinner medication as  prescribed by your surgeon.  Continue your approved medications as instructed at time of discharge.  PRECAUTIONS If you experience chest pain or shortness of breath - call 911 immediately for transfer to the hospital emergency department.  If you develop a fever greater that 101 F, purulent drainage from wound, increased redness or drainage from wound, foul odor from the wound/dressing, or calf pain - CONTACT YOUR SURGEON.                                                   FOLLOW-UP APPOINTMENTS Make sure you keep all of your appointments after your operation with your surgeon and caregivers. You should call the office at the above phone number and make an appointment for approximately two weeks after the date of your surgery or on the date instructed by your surgeon outlined in the "After Visit Summary".  RANGE OF MOTION AND STRENGTHENING EXERCISES  These exercises are designed to help you keep full movement of your hip joint. Follow your caregiver's or physical therapist's instructions. Perform all exercises about fifteen times, three times per day or as directed. Exercise both hips, even if you have had only one joint replacement. These exercises can be done on a training (exercise) mat, on the floor, on a table or on a bed. Use whatever works the best and is most comfortable for you. Use music or television  while you are exercising so that the exercises are a pleasant break in your day. This will make your life better with the exercises acting as a break in routine you can look forward to.  Lying on your back, slowly slide your foot toward your buttocks, raising your knee up off the floor. Then slowly slide your foot back down until your leg is straight again.  Lying on your back spread your legs as far apart as you can without causing discomfort.  Lying on your side, raise your upper leg and foot straight up from the floor as far as is comfortable. Slowly lower the leg and repeat.  Lying on your back, tighten up the muscle in the front of your thigh (quadriceps muscles). You can do this by keeping your leg straight and trying to raise your heel off the floor. This helps strengthen the largest muscle supporting your knee.  Lying on your back, tighten up the muscles of your buttocks both with the legs straight and with the knee bent at a comfortable angle while keeping your heel on the floor.   IF YOU ARE TRANSFERRED TO A SKILLED REHAB FACILITY If the patient is transferred to a skilled rehab facility following release from the hospital, a list of the current medications will be sent to the facility for the patient to continue.  When discharged from the skilled rehab facility, please have the facility set up the patient's [place] prior to being released. Also, the skilled facility will be responsible for providing the patient with their medications at time of release from the facility to include their pain medication, the muscle relaxants, and their blood thinner medication. If the patient is still at the rehab facility at time of the two week follow up appointment, the skilled rehab facility will also need to assist the patient in arranging follow up appointment in our office and any transportation needs.  MAKE SURE YOU:  Understand these instructions.  Get help right away if you are  not doing well or get worse.    Pick up stool softner and laxative for home use following surgery while on pain medications. Do not submerge incision under water. Please use good hand washing techniques while changing dressing each day. May shower starting three days after surgery. Please use a clean towel to pat the incision dry following showers. Continue to use ice for pain and swelling after surgery. Do not use any lotions or creams on the incision until instructed by your surgeon.   Do not sit on low chairs, stoools or toilet seats, as it may be difficult to get up from low surfaces   Complete by:  As directed    Driving restrictions   Complete by:  As directed    No driving for two weeks   TED hose   Complete by:  As directed    Use stockings (TED hose) for three weeks on both leg(s).  You may remove them at night for sleeping.   Weight bearing as tolerated   Complete by:  As directed      Allergies as of 09/29/2018      Reactions   Betadine [povidone Iodine] Other (See Comments)   Anxiety,  Makes her climb the walls      Medication List    STOP taking these medications   naproxen sodium 220 MG tablet Commonly known as:  ALEVE     TAKE these medications   aspirin 325 MG EC tablet Take 1 tablet (325 mg total) by mouth 2 (two) times daily for 20 days. Take one tablet (325 mg) Aspirin two times a day for three weeks following surgery. Then take one baby Aspirin (81 mg) once a day for three weeks. Then discontinue aspirin.   methocarbamol 500 MG tablet Commonly known as:  ROBAXIN Take 1 tablet (500 mg total) by mouth every 6 (six) hours as needed for muscle spasms.   multivitamin tablet Take 1 tablet by mouth daily.   OVER THE COUNTER MEDICATION Take 3 tablets by mouth daily. Instaflex Joint supplement   OVER THE COUNTER MEDICATION Apply 1 application topically daily as needed (for arthritis pain). Deep Freeze Cream   SYSTANE 0.4-0.3 % Soln Generic drug:   Polyethyl Glycol-Propyl Glycol Place 1 drop into both eyes 3 (three) times daily as needed (for dry eyes).   traMADol 50 MG tablet Commonly known as:  ULTRAM Take 1-2 tablets (50-100 mg total) by mouth every 6 (six) hours as needed for moderate pain.            Discharge Care Instructions  (From admission, onward)         Start     Ordered   09/29/18 0000  Weight bearing as tolerated     09/29/18 0750   09/29/18 0000  Change dressing    Comments:  Change the dressing daily with sterile 4 x 4 inch gauze dressing and paper tape.   09/29/18 0750         Follow-up Information    Gaynelle Arabian, MD. Schedule an appointment as soon as possible for a visit on 10/13/2018.   Specialty:  Orthopedic Surgery Contact information: [address] [place] [address] [phone]           Signed: Theresa Duty, PA-C Orthopedic Surgery 10/05/2018, 9:09 AM

## 2018-11-17 IMAGING — RF DG HIP (WITH PELVIS) OPERATIVE*L*
1 series · 2 of 2 positions shown · non-contrast
Comparison: AP pelvis of December 03, 2017

CLINICAL DATA: Status post left total hip joint prosthesis
placement.

EXAM:
OPERATIVE left HIP (WITH PELVIS IF PERFORMED) 1 VIEWS
TECHNIQUE: Fluoroscopic spot image(s) were submitted for interpretation
post-operatively.

[Series 1: run · 2 of 2 slices shown]
[im 1/2]
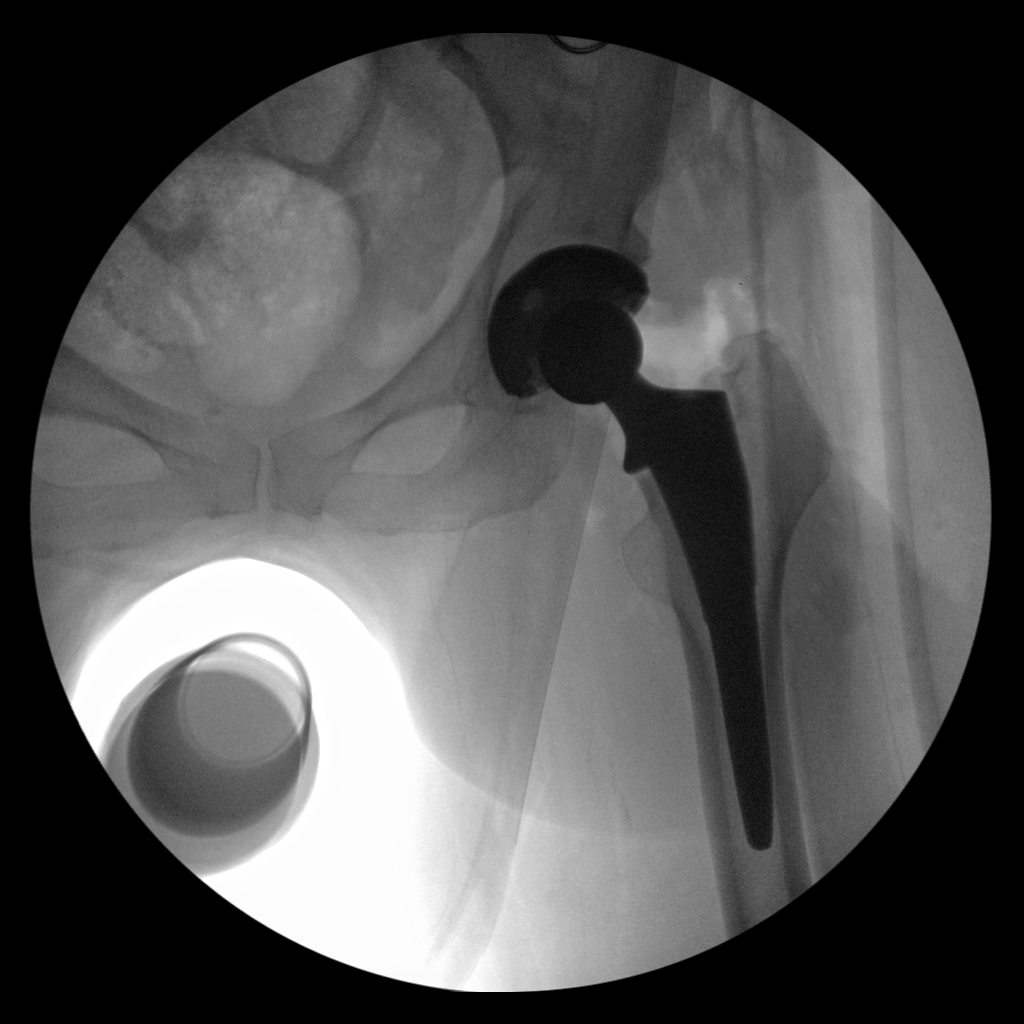
[im 2/2]
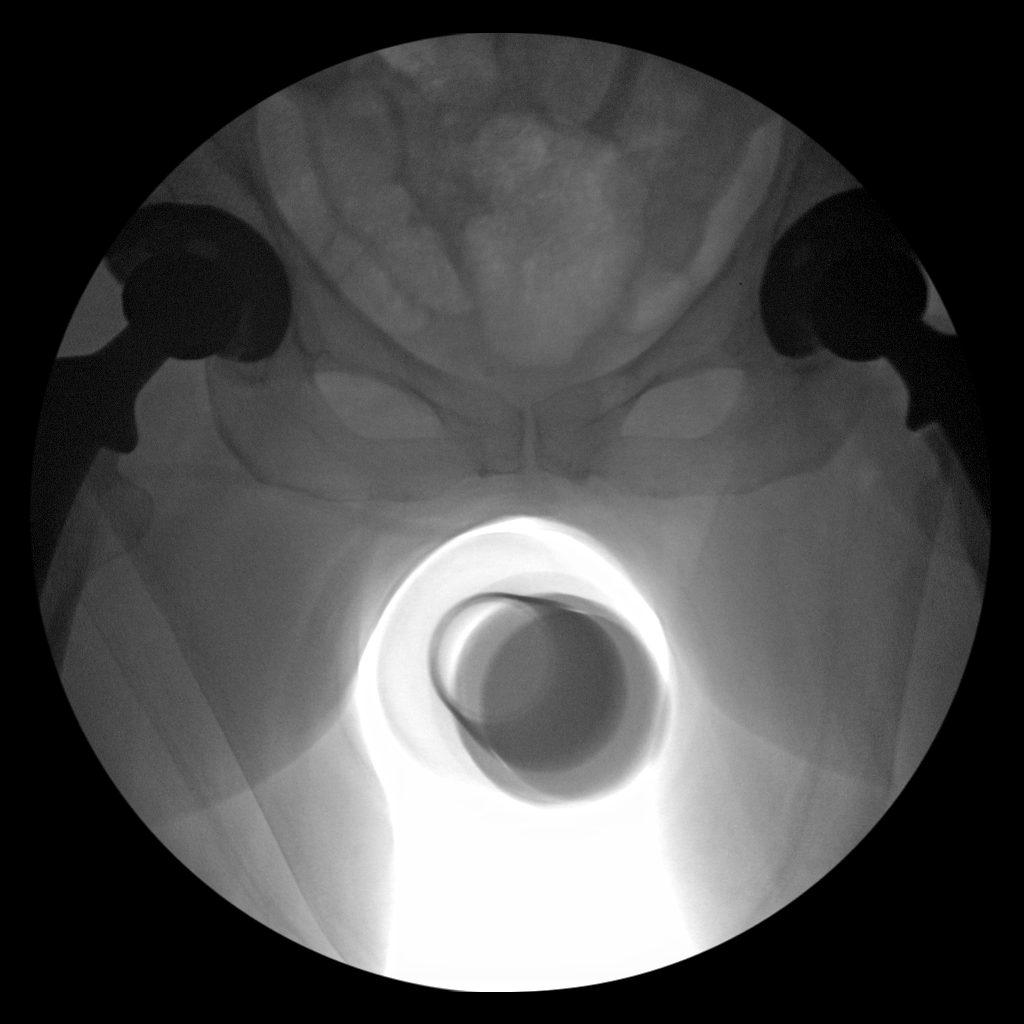

[2 of 2 positions shown; findings below may reference images not displayed]

FINDINGS: There is a pre-existing right hip joint prosthesis. On the left
partially imaged is a prosthetic left hip joint. The femoral
component is not included in the field of view. Reported fluoro time
is 15.8 seconds with a dose of 167 mGy.
IMPRESSION: Limited view of the prosthetic left hip joint. The acetabular
component is included almost in its entirety and is grossly normal
but the femoral component is not included.

## 2018-11-17 IMAGING — DX DG PORTABLE PELVIS
1 series · 1 of 1 positions shown · non-contrast
Comparison: 12/03/2017

CLINICAL DATA: Post left total hip arthroplasty.

EXAM:
PORTABLE PELVIS 1-2 VIEWS

[pelvis ap]
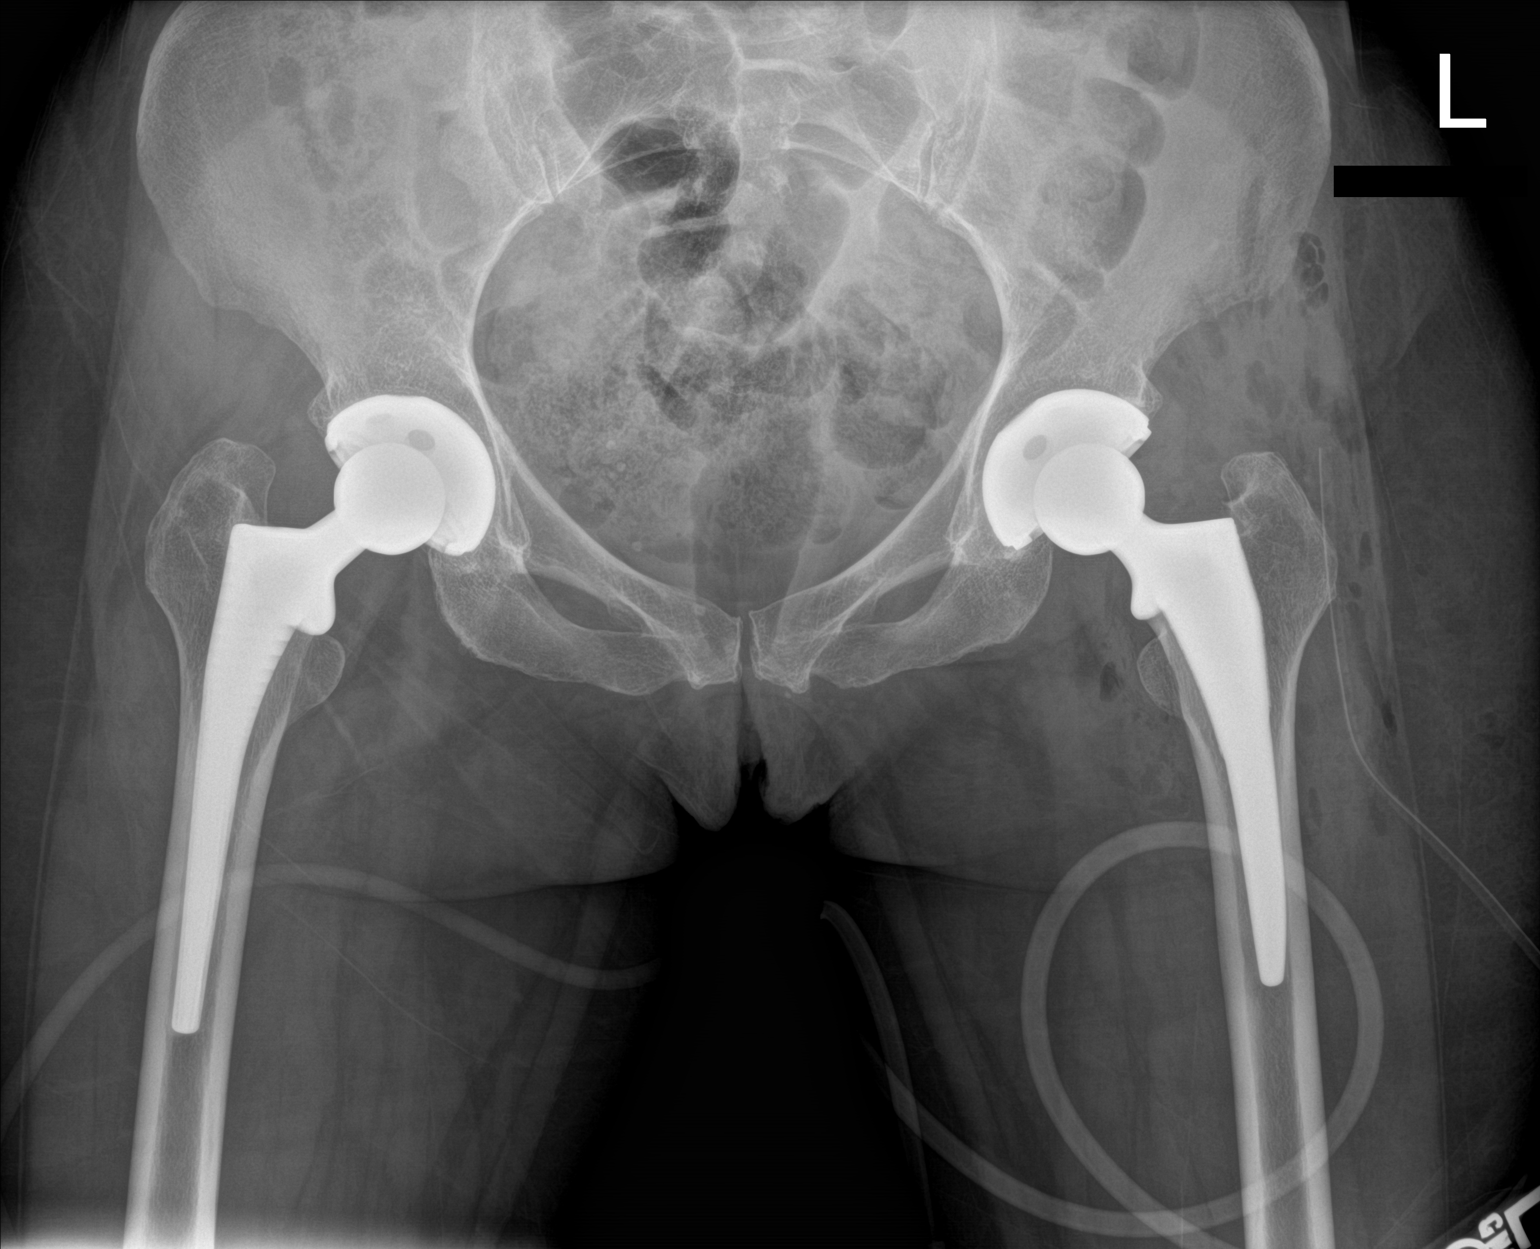

[1 of 1 positions shown; findings below may reference images not displayed]

FINDINGS: Examination demonstrates no change in patient's right total hip
arthroplasty. Evidence of recent left total hip arthroplasty with
prosthesis intact and normally located. Surgical drain over the soft
tissues of the lateral left hip. Remainder the exam is unchanged.
IMPRESSION: Expected changes post left total hip arthroplasty.

## 2018-12-31 ENCOUNTER — Other Ambulatory Visit: Payer: Self-pay | Admitting: Gastroenterology

## 2018-12-31 DIAGNOSIS — R195 Other fecal abnormalities: Secondary | ICD-10-CM

## 2019-02-08 ENCOUNTER — Ambulatory Visit
Admission: RE | Admit: 2019-02-08 | Discharge: 2019-02-08 | Disposition: A | Discharge status: Home / Self Care | Payer: Commercial Managed Care - PPO | Source: Ambulatory Visit | Attending: Gastroenterology | Admitting: Gastroenterology

## 2019-02-08 DIAGNOSIS — R195 Other fecal abnormalities: Secondary | ICD-10-CM

## 2023-01-14 LAB — LAB REPORT - SCANNED: EGFR: 105

## 2023-03-05 ENCOUNTER — Ambulatory Visit: Payer: Commercial Managed Care - PPO | Attending: Internal Medicine | Admitting: Cardiology

## 2023-03-05 ENCOUNTER — Ambulatory Visit: Payer: Commercial Managed Care - PPO | Admitting: Internal Medicine

## 2023-03-05 ENCOUNTER — Ambulatory Visit (INDEPENDENT_AMBULATORY_CARE_PROVIDER_SITE_OTHER): Payer: Commercial Managed Care - PPO

## 2023-03-05 ENCOUNTER — Encounter: Payer: Self-pay | Admitting: Cardiology

## 2023-03-05 VITALS — Ht 64.5 in | Wt 130.0 lb

## 2023-03-05 DIAGNOSIS — R002 Palpitations: Secondary | ICD-10-CM

## 2023-03-05 DIAGNOSIS — I351 Nonrheumatic aortic (valve) insufficiency: Secondary | ICD-10-CM

## 2023-03-05 DIAGNOSIS — Z0000 Encounter for general adult medical examination without abnormal findings: Secondary | ICD-10-CM

## 2023-03-05 DIAGNOSIS — R079 Chest pain, unspecified: Secondary | ICD-10-CM

## 2023-03-05 DIAGNOSIS — R0602 Shortness of breath: Secondary | ICD-10-CM

## 2023-03-05 DIAGNOSIS — R030 Elevated blood-pressure reading, without diagnosis of hypertension: Secondary | ICD-10-CM

## 2023-03-05 DIAGNOSIS — Z01812 Encounter for preprocedural laboratory examination: Secondary | ICD-10-CM

## 2023-03-05 MED ORDER — METOPROLOL TARTRATE 100 MG PO TABS: 100.0000 mg | ORAL_TABLET | Freq: Once | ORAL | 0 refills | Status: DC

## 2023-03-05 NOTE — Addendum Note (Signed)
Addended by: Rexene Edison L on: 03/05/2023 11:11 AM   Modules accepted: Orders

## 2023-03-05 NOTE — Progress Notes (Unsigned)
Enrolled patient for a 14 day [employer] XT  monitor to be mailed to patients home  °

## 2023-03-05 NOTE — Progress Notes (Addendum)
Cardiology CONSULT Note    Date:  03/05/2023   ID:  Rachel Fleming, DOB 1956/10/07, MRN [id number]  PCP:  April Manson, NP  Cardiologist:  Armanda Magic, MD   Chief Complaint  Patient presents with   New Patient (Initial Visit)    Shortness of breath and aortic insufficiency    Patient Profile: Rachel Fleming is a 67 y.o. female who is being seen today for the evaluation of shortness of breath, aortic insufficiency, mitral regurgitation at the request of April Manson, NP.  History of Present Illness:  Rachel Fleming is a 66 y.o. female who is being seen today for the evaluation of shortness of breath, AR, MR at the request of White, Bonnell Public, NP.  This is a 67 year old female who was last seen by cardiology in 2019.  She has a history of grade 2 diastolic dysfunction and mild aortic insufficiency by 2D echo 2019.  She was lost to follow-up and now presents to reestablish cardiac care.  She has been having labile HTN, palpitations and chest pain recently. She had 1 episode of CP recently that started while watching TV and got up to go to the bathroom and was sitting on the commode and started having CP.  She describes it as tightness around her chest like a tight band that started in her left breast and went across to the right side of her chest.  She went outside to get some cool air and it resolved after 3-4 minutes.  THere was no radiation, nausea or diaphoresis but did feel dizzy like she was going to pass out.  She has not had any further episodes since then.    She tells me a few weeks ago she got up and her heart rate was 115bpm and felt her her heart was pounding out of her chest.  Her BP at the time was 106/60mmHg.  She has been checking her BP and has been as high as 151/73mmHg.  This past weekend her BP was 96/23mmHg in the am and then normalized for a while and then dropped 94/44mmHg.     Cardiac Studies & Procedures       ECHOCARDIOGRAM  ECHOCARDIOGRAM COMPLETE  06/11/2018  Narrative *[facility] [address] [place], [place] [address] [phone]  ------------------------------------------------------------------- Transthoracic Echocardiography  Patient:    Rachel, Fleming MR #:       [id number] Study Date: 06/11/2018 Gender:     F Age:        7 Height:     165.1 cm Weight:     57.4 kg BSA:        1.62 m^2 Pt. Status: Room:  ATTENDING    Rollene Rotunda, MD ORDERING     Rollene Rotunda, MD REFERRING    Rollene Rotunda, MD SONOGRAPHER  Aida Raider, RDCS PERFORMING   [facility]  cc:  ------------------------------------------------------------------- LV EF: 60% -   65%  ------------------------------------------------------------------- Indications:      I35.1 Aortic Valve Insufficiency.  I34.0 Mitral Vale Insufficiency.  ------------------------------------------------------------------- History:   PMH:  Acquired from the patient and from the patient&'s chart.  ------------------------------------------------------------------- Study Conclusions  - Left ventricle: The cavity size was normal. Wall thickness was normal. Systolic function was normal. The estimated ejection fraction was in the range of 60% to 65%. Wall motion was normal; there were no regional wall motion abnormalities. Features are consistent with a pseudonormal left ventricular filling pattern, with concomitant abnormal relaxation  and increased filling pressure (grade 2 diastolic dysfunction). - Aortic valve: There was mild regurgitation.  ------------------------------------------------------------------- Study data:   Study status:  Routine.  Procedure:  The patient reported no pain pre or post test. Transthoracic echocardiography for left ventricular function evaluation, for right ventricular function evaluation, and for assessment of valvular function. Image quality was adequate.  Study completion:  There were no complications.           Transthoracic echocardiography.  M-mode, complete 2D, spectral Doppler, and color Doppler.  Birthdate: Patient birthdate: [birth year].  Age:  Patient is 67 yr old.  Sex: Gender: female.    BMI: 21.1 kg/m^2.  Blood pressure:     134/76 Patient status:  Outpatient.  Study date:  Study date: 06/11/2018. Study time: 07:50 AM.  Location:  [facility]  -------------------------------------------------------------------  ------------------------------------------------------------------- Left ventricle:  The cavity size was normal. Wall thickness was normal. Systolic function was normal. The estimated ejection fraction was in the range of 60% to 65%. Wall motion was normal; there were no regional wall motion abnormalities. Features are consistent with a pseudonormal left ventricular filling pattern, with concomitant abnormal relaxation and increased filling pressure (grade 2 diastolic dysfunction).  ------------------------------------------------------------------- Aortic valve:   Structurally normal valve.   Cusp separation was normal.  Doppler:  Transvalvular velocity was within the normal range. There was no stenosis. There was mild regurgitation.    VTI ratio of LVOT to aortic valve: 0.76. Valve area (VTI): 2.16 cm^2. Indexed valve area (VTI): 1.33 cm^2/m^2. Peak velocity ratio of LVOT to aortic valve: 0.71. Valve area (Vmax): 2.03 cm^2. Indexed valve area (Vmax): 1.25 cm^2/m^2. Mean velocity ratio of LVOT to aortic valve: 0.71. Valve area (Vmean): 2.01 cm^2. Indexed valve area (Vmean): 1.24 cm^2/m^2.    Mean gradient (S): 8 mm Hg. Peak gradient (S): 16 mm Hg.  ------------------------------------------------------------------- Aorta:  Aortic root: The aortic root was normal in size. Ascending aorta: The ascending aorta was normal in size.  ------------------------------------------------------------------- Mitral valve:   Structurally normal valve.   Leaflet separation  was normal.  Doppler:  Transvalvular velocity was within the normal range. There was no evidence for stenosis. There was no regurgitation.    Peak gradient (D): 4 mm Hg.  ------------------------------------------------------------------- Left atrium:  The atrium was normal in size.  ------------------------------------------------------------------- Right ventricle:  The cavity size was normal. Systolic function was normal.  ------------------------------------------------------------------- Pulmonic valve:    The valve appears to be grossly normal. Doppler:  There was no significant regurgitation.  ------------------------------------------------------------------- Tricuspid valve:   The valve appears to be grossly normal. Doppler:  There was mild regurgitation.  ------------------------------------------------------------------- Right atrium:  The atrium was normal in size.  ------------------------------------------------------------------- Pericardium:  There was no pericardial effusion.  ------------------------------------------------------------------- Systemic veins: Inferior vena cava: The vessel was normal in size. The respirophasic diameter changes were in the normal range (>= 50%), consistent with normal central venous pressure.  ------------------------------------------------------------------- Measurements  Left ventricle                            Value          Reference LV ID, ED, PLAX chordal           (L)     35.5  mm       43 - 52 LV ID, ES, PLAX chordal                   23.5  mm  23 - 38 LV fx shortening, PLAX chordal            34    %        >=29 LV PW thickness, ED                       10.3  mm       --------- IVS/LV PW ratio, ED                       0.66           <=1.3 Stroke volume, 2D                         89    ml       --------- Stroke volume/bsa, 2D                     55    ml/m^2   --------- LV e&', lateral                             9.54  cm/s     --------- LV E/e&', lateral                          10.23          --------- LV e&', medial                             9.54  cm/s     --------- LV E/e&', medial                           10.23          --------- LV e&', average                            9.54  cm/s     --------- LV E/e&', average                          10.23          ---------  Ventricular septum                        Value          Reference IVS thickness, ED                         6.79  mm       ---------  LVOT                                      Value          Reference LVOT ID, S                                19    mm       --------- LVOT area  2.84  cm^2     --------- LVOT ID                                   19    mm       --------- LVOT peak velocity, S                     142   cm/s     --------- LVOT mean velocity, S                     95.7  cm/s     --------- LVOT VTI, S                               31.2  cm       --------- LVOT peak gradient, S                     8     mm Hg    --------- Stroke volume (SV), LVOT DP               88.5  ml       --------- Stroke index (SV/bsa), LVOT DP            54.5  ml/m^2   ---------  Aortic valve                              Value          Reference Aortic valve peak velocity, S             199   cm/s     --------- Aortic valve mean velocity, S             135   cm/s     --------- Aortic valve VTI, S                       41.1  cm       --------- Aortic mean gradient, S                   8     mm Hg    --------- Aortic peak gradient, S                   16    mm Hg    --------- VTI ratio, LVOT/AV                        0.76           --------- Aortic valve area, VTI                    2.16  cm^2     --------- Aortic valve area/bsa, VTI                1.33  cm^2/m^2 --------- Velocity ratio, peak, LVOT/AV             0.71           --------- Aortic valve area, peak velocity          2.03  cm^2      --------- Aortic valve area/bsa, peak  1.25  cm^2/m^2 --------- velocity Velocity ratio, mean, LVOT/AV             0.71           --------- Aortic valve area, mean velocity          2.01  cm^2     --------- Aortic valve area/bsa, mean               1.24  cm^2/m^2 --------- velocity Aortic regurg pressure half-time          498   ms       ---------  Aorta                                     Value          Reference Aortic root ID, ED                        30    mm       ---------  Left atrium                               Value          Reference LA ID, A-P, ES                            36    mm       --------- LA ID/bsa, A-P                    (H)     2.22  cm/m^2   <=2.2 LA volume, S                              38    ml       --------- LA volume/bsa, S                          23.4  ml/m^2   --------- LA volume, ES, 1-p A4C                    37    ml       --------- LA volume/bsa, ES, 1-p A4C                22.8  ml/m^2   --------- LA volume, ES, 1-p A2C                    39    ml       --------- LA volume/bsa, ES, 1-p A2C                24    ml/m^2   ---------  Mitral valve                              Value          Reference Mitral E-wave peak velocity               97.6  cm/s     --------- Mitral A-wave peak velocity  76.8  cm/s     --------- Mitral deceleration time          (H)     239   ms       150 - 230 Mitral peak gradient, D                   4     mm Hg    --------- Mitral E/A ratio, peak                    1.3            ---------  Tricuspid valve                           Value          Reference Tricuspid regurg peak velocity            225   cm/s     --------- Tricuspid peak RV-RA gradient             20    mm Hg    ---------  Right ventricle                           Value          Reference RV s&', lateral, S                         16.5  cm/s     ---------  Legend: (L)  and  (H)  mark values outside specified reference  range.  ------------------------------------------------------------------- Prepared and Electronically Authenticated by  Kristeen Miss, M.D. 2019-07-18T11:45:12     CT SCANS  CT CARDIAC SCORING (SELF PAY ONLY) 06/11/2018  Addendum 06/11/2018  1:54 PM ADDENDUM REPORT: 06/11/2018 13:51  CLINICAL DATA:  Risk stratification  EXAM: Coronary Calcium Score  TECHNIQUE: The patient was scanned on a [employer] scanner. Axial non-contrast 3 mm slices were carried out through the heart. The data set was analyzed on a dedicated work station and scored using the Agatson method.  FINDINGS: Non-cardiac: See separate report from [facility].  Ascending Aorta: Normal size, minimal focal calcium in the aortic root.  Pericardium: Normal.  Coronary arteries: Normal origin.  IMPRESSION: Coronary calcium score of 0. This was 0 percentile for age and sex matched control.   Electronically Signed By: Tobias Alexander On: 06/11/2018 13:51  Addendum 06/11/2018  1:54 PM ADDENDUM REPORT: 06/11/2018 13:51  CLINICAL DATA:  Risk stratification  EXAM: Coronary Calcium Score  TECHNIQUE: The patient was scanned on a [employer] scanner. Axial non-contrast 3 mm slices were carried out through the heart. The data set was analyzed on a dedicated work station and scored using the Agatson method.  FINDINGS: Non-cardiac: See separate report from [facility].  Ascending Aorta: Normal size, minimal focal calcium in the aortic root.  Pericardium: Normal.  Coronary arteries: Normal origin.  IMPRESSION: Coronary calcium score of 0. This was 0 percentile for age and sex matched control.   Electronically Signed By: Tobias Alexander On: 06/11/2018 13:51  Narrative EXAM: OVER-READ INTERPRETATION  CT CHEST  The following report is an over-read performed by radiologist Dr. Genevive Bi of [facility] on 06/11/2018. This over-read does not include  interpretation of cardiac or coronary anatomy or pathology. The calcium score interpretation by the cardiologist is attached.  COMPARISON:  None.  FINDINGS: Limited view of the lung parenchyma demonstrates no suspicious nodularity. Airways are normal.  Limited view of the mediastinum demonstrates no adenopathy. Esophagus normal.  Limited view of the upper abdomen unremarkable.  Limited view of the skeleton and chest wall is unremarkable.  IMPRESSION: No significant extracardiac findings  Electronically Signed: By: Genevive Bi M.D. 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    History of kidney stones 2017   passed stone no surgery required   PONV (postoperative nausea and vomiting)    scop patch, zofran & decadron works great for patient - see 07/09/18 anesthesia event    Past Surgical History:  Procedure Laterality Date   ABDOMINAL HYSTERECTOMY  2004   ABDOMINAL SURGERY  1996   mass remaved   COLONOSCOPY     normal   INCISIONAL HERNIA REPAIR  [facility]/06/2018   [facility]    LAPAROSCOPIC BILATERAL SALPINGO OOPHERECTOMY Bilateral 07/09/2018   Procedure: LAPAROSCOPIC BILATERAL SALPINGO OOPHORECTOMY, cystocopy;  Surgeon: Richardean Chimera, MD;  Location: [facility];  Service: Gynecology;  Laterality: Bilateral;   SALPINGOOPHORECTOMY Bilateral 06/2018   SPLENECTOMY, TOTAL  1996   TOTAL HIP ARTHROPLASTY Right 12/03/2017   Procedure: RIGHT TOTAL HIP ARTHROPLASTY ANTERIOR APPROACH;  Surgeon: Ollen Gross, MD;  Location: [facility];  Service: Orthopedics;  Laterality: Right;   TOTAL HIP ARTHROPLASTY Left 09/28/2018   Procedure: LEFT TOTAL HIP ARTHROPLASTY ANTERIOR APPROACH;  Surgeon: Ollen Gross, MD;  Location: [facility];  Service: Orthopedics;  Laterality: Left;    TUBAL  LIGATION N/A 08/06/2018   Procedure: REPAIR OF INCISIONAL HERNIA;  Surgeon: Richardean Chimera, MD;  Location: [facility];  Service: Gynecology;  Laterality: N/A;   WISDOM TOOTH EXTRACTION      Current Medications: Current Meds  Medication Sig   Multiple Vitamin (MULTIVITAMIN) tablet Take 1 tablet by mouth daily.   naproxen sodium (ALEVE) 220 MG tablet Take 220 mg by mouth 2 (two) times daily as needed.   OVER THE COUNTER MEDICATION Take 3 tablets by mouth daily. Instaflex Joint supplement   OVER THE COUNTER MEDICATION Apply 1 application topically daily as needed (for arthritis pain). Deep Freeze Cream   Polyethyl Glycol-Propyl Glycol (SYSTANE) 0.4-0.3 % SOLN Place 1 drop into both eyes 3 (three) times daily as needed (for dry eyes).     Allergies:   Betadine [povidone iodine]   Social History   Socioeconomic History   Marital status: Married    Spouse name: Tim   Number of children: 0   Years of education: Not on file   Highest education level: Not on file  Occupational History   Not on file  Tobacco Use   Smoking status: Former    Packs/day: 0.25    Years: 8.00    Additional pack years: 0.00    Total pack years: 2.00    Types: Cigarettes   Smokeless tobacco: Never   Tobacco comments:    quit at 26   Vaping Use   Vaping Use: Never used  Substance and Sexual Activity   Alcohol use: No   Drug use: No   Sexual activity: Not on file    Comment: Hysterectomy  Other Topics Concern   Not on file  Social History  Narrative   Lives with husband.     Social Determinants of Health   Financial Resource Strain: Not on file  Food Insecurity: Not on file  Transportation Needs: Not on file  Physical Activity: Not on file  Stress: Not on file  Social Connections: Not on file     Family History:  The patient's family history includes Arthritis in her paternal grandmother; Bone cancer in her maternal grandfather; Cancer in her maternal uncle; Gallstones in her mother; Heart disease in  her paternal grandmother; High Cholesterol in her father; Kidney Stones in her father; Leukemia in her maternal grandfather; Ovarian cancer in her maternal grandmother.   ROS:   Please see the history of present illness.    ROS All other systems reviewed and are negative.      No data to display             PHYSICAL EXAM:   VS:  BP 132/86   Pulse 88   Ht 5' 4.5" (1.638 m)   Wt 130 lb (59 kg)   LMP  (LMP Unknown)   BMI 21.97 kg/m    GEN: Well nourished, well developed, in no acute distress  HEENT: normal  Neck: no JVD, carotid bruits, or masses Cardiac: RRR; no murmurs, rubs, or gallops,no edema.  Intact distal pulses bilaterally.  Respiratory:  clear to auscultation bilaterally, normal work of breathing GI: soft, nontender, nondistended, + BS MS: no deformity or atrophy  Skin: warm and dry, no rash Neuro:  Alert and Oriented x 3, Strength and sensation are intact Psych: euthymic mood, full affect  Wt Readings from Last 3 Encounters:  03/05/23 130 lb (59 kg)  09/28/18 128 lb (58.1 kg)  09/21/18 128 lb (58.1 kg)      Studies/Labs Reviewed:   EKG:  EKG is ordered today.  The ekg ordered today demonstrates NSR with anteroseptal infarct and lateral ischemia with T wave inversions in V4-V6  Cardiac Studies & Procedures       ECHOCARDIOGRAM  ECHOCARDIOGRAM COMPLETE 06/11/2018  Narrative *[facility]* [address] [place], [place] [address] [phone]  ------------------------------------------------------------------- Transthoracic Echocardiography  Patient:    Verlisa, Vara MR #:       [id number] Study Date: 06/11/2018 Gender:     F Age:        89 Height:     165.1 cm Weight:     57.4 kg BSA:        1.62 m^2 Pt. Status: Room:  ATTENDING    Rollene Rotunda, MD ORDERING     Rollene Rotunda, MD REFERRING    Rollene Rotunda, MD SONOGRAPHER  Aida Raider, RDCS PERFORMING   [facility],  Outpatient  cc:  ------------------------------------------------------------------- LV EF: 60% -   65%  ------------------------------------------------------------------- Indications:      I35.1 Aortic Valve Insufficiency.  I34.0 Mitral Vale Insufficiency.  ------------------------------------------------------------------- History:   PMH:  Acquired from the patient and from the patient&'s chart.  ------------------------------------------------------------------- Study Conclusions  - Left ventricle: The cavity size was normal. Wall thickness was normal. Systolic function was normal. The estimated ejection fraction was in the range of 60% to 65%. Wall motion was normal; there were no regional wall motion abnormalities. Features are consistent with a pseudonormal left ventricular filling pattern, with concomitant abnormal relaxation and increased filling pressure (grade 2 diastolic dysfunction). - Aortic valve: There was mild regurgitation.  ------------------------------------------------------------------- Study data:   Study status:  Routine.  Procedure:  The patient reported no pain pre or  post test. Transthoracic echocardiography for left ventricular function evaluation, for right ventricular function evaluation, and for assessment of valvular function. Image quality was adequate.  Study completion:  There were no complications.          Transthoracic echocardiography.  M-mode, complete 2D, spectral Doppler, and color Doppler.  Birthdate: Patient birthdate: [birth year].  Age:  Patient is 67 yr old.  Sex: Gender: female.    BMI: 21.1 kg/m^2.  Blood pressure:     134/76 Patient status:  Outpatient.  Study date:  Study date: 06/11/2018. Study time: 07:50 AM.  Location:  [facility]  -------------------------------------------------------------------  ------------------------------------------------------------------- Left ventricle:  The cavity size was normal. Wall  thickness was normal. Systolic function was normal. The estimated ejection fraction was in the range of 60% to 65%. Wall motion was normal; there were no regional wall motion abnormalities. Features are consistent with a pseudonormal left ventricular filling pattern, with concomitant abnormal relaxation and increased filling pressure (grade 2 diastolic dysfunction).  ------------------------------------------------------------------- Aortic valve:   Structurally normal valve.   Cusp separation was normal.  Doppler:  Transvalvular velocity was within the normal range. There was no stenosis. There was mild regurgitation.    VTI ratio of LVOT to aortic valve: 0.76. Valve area (VTI): 2.16 cm^2. Indexed valve area (VTI): 1.33 cm^2/m^2. Peak velocity ratio of LVOT to aortic valve: 0.71. Valve area (Vmax): 2.03 cm^2. Indexed valve area (Vmax): 1.25 cm^2/m^2. Mean velocity ratio of LVOT to aortic valve: 0.71. Valve area (Vmean): 2.01 cm^2. Indexed valve area (Vmean): 1.24 cm^2/m^2.    Mean gradient (S): 8 mm Hg. Peak gradient (S): 16 mm Hg.  ------------------------------------------------------------------- Aorta:  Aortic root: The aortic root was normal in size. Ascending aorta: The ascending aorta was normal in size.  ------------------------------------------------------------------- Mitral valve:   Structurally normal valve.   Leaflet separation was normal.  Doppler:  Transvalvular velocity was within the normal range. There was no evidence for stenosis. There was no regurgitation.    Peak gradient (D): 4 mm Hg.  ------------------------------------------------------------------- Left atrium:  The atrium was normal in size.  ------------------------------------------------------------------- Right ventricle:  The cavity size was normal. Systolic function was normal.  ------------------------------------------------------------------- Pulmonic valve:    The valve appears to be  grossly normal. Doppler:  There was no significant regurgitation.  ------------------------------------------------------------------- Tricuspid valve:   The valve appears to be grossly normal. Doppler:  There was mild regurgitation.  ------------------------------------------------------------------- Right atrium:  The atrium was normal in size.  ------------------------------------------------------------------- Pericardium:  There was no pericardial effusion.  ------------------------------------------------------------------- Systemic veins: Inferior vena cava: The vessel was normal in size. The respirophasic diameter changes were in the normal range (>= 50%), consistent with normal central venous pressure.  ------------------------------------------------------------------- Measurements  Left ventricle                            Value          Reference LV ID, ED, PLAX chordal           (L)     35.5  mm       43 - 52 LV ID, ES, PLAX chordal                   23.5  mm       23 - 38 LV fx shortening, PLAX chordal            34    %        >=  29 LV PW thickness, ED                       10.3  mm       --------- IVS/LV PW ratio, ED                       0.66           <=1.3 Stroke volume, 2D                         89    ml       --------- Stroke volume/bsa, 2D                     55    ml/m^2   --------- LV e&', lateral                            9.54  cm/s     --------- LV E/e&', lateral                          10.23          --------- LV e&', medial                             9.54  cm/s     --------- LV E/e&', medial                           10.23          --------- LV e&', average                            9.54  cm/s     --------- LV E/e&', average                          10.23          ---------  Ventricular septum                        Value          Reference IVS thickness, ED                         6.79  mm       ---------  LVOT                                       Value          Reference LVOT ID, S                                19    mm       --------- LVOT area                                 2.84  cm^2     --------- LVOT ID  19    mm       --------- LVOT peak velocity, S                     142   cm/s     --------- LVOT mean velocity, S                     95.7  cm/s     --------- LVOT VTI, S                               31.2  cm       --------- LVOT peak gradient, S                     8     mm Hg    --------- Stroke volume (SV), LVOT DP               88.5  ml       --------- Stroke index (SV/bsa), LVOT DP            54.5  ml/m^2   ---------  Aortic valve                              Value          Reference Aortic valve peak velocity, S             199   cm/s     --------- Aortic valve mean velocity, S             135   cm/s     --------- Aortic valve VTI, S                       41.1  cm       --------- Aortic mean gradient, S                   8     mm Hg    --------- Aortic peak gradient, S                   16    mm Hg    --------- VTI ratio, LVOT/AV                        0.76           --------- Aortic valve area, VTI                    2.16  cm^2     --------- Aortic valve area/bsa, VTI                1.33  cm^2/m^2 --------- Velocity ratio, peak, LVOT/AV             0.71           --------- Aortic valve area, peak velocity          2.03  cm^2     --------- Aortic valve area/bsa, peak               1.25  cm^2/m^2 --------- velocity Velocity ratio, mean, LVOT/AV             0.71           --------- Aortic valve area, mean velocity  2.01  cm^2     --------- Aortic valve area/bsa, mean               1.24  cm^2/m^2 --------- velocity Aortic regurg pressure half-time          498   ms       ---------  Aorta                                     Value          Reference Aortic root ID, ED                        30    mm       ---------  Left atrium                               Value           Reference LA ID, A-P, ES                            36    mm       --------- LA ID/bsa, A-P                    (H)     2.22  cm/m^2   <=2.2 LA volume, S                              38    ml       --------- LA volume/bsa, S                          23.4  ml/m^2   --------- LA volume, ES, 1-p A4C                    37    ml       --------- LA volume/bsa, ES, 1-p A4C                22.8  ml/m^2   --------- LA volume, ES, 1-p A2C                    39    ml       --------- LA volume/bsa, ES, 1-p A2C                24    ml/m^2   ---------  Mitral valve                              Value          Reference Mitral E-wave peak velocity               97.6  cm/s     --------- Mitral A-wave peak velocity               76.8  cm/s     --------- Mitral deceleration time          (H)     239   ms       150 - 230 Mitral peak gradient, D  4     mm Hg    --------- Mitral E/A ratio, peak                    1.3            ---------  Tricuspid valve                           Value          Reference Tricuspid regurg peak velocity            225   cm/s     --------- Tricuspid peak RV-RA gradient             20    mm Hg    ---------  Right ventricle                           Value          Reference RV s&', lateral, S                         16.5  cm/s     ---------  Legend: (L)  and  (H)  mark values outside specified reference range.  ------------------------------------------------------------------- Prepared and Electronically Authenticated by  Kristeen Miss, M.D. 2019-07-18T11:45:12     CT SCANS  CT CARDIAC SCORING (SELF PAY ONLY) 06/11/2018  Addendum 06/11/2018  1:54 PM ADDENDUM REPORT: 06/11/2018 13:51  CLINICAL DATA:  Risk stratification  EXAM: Coronary Calcium Score  TECHNIQUE: The patient was scanned on a [employer] scanner. Axial non-contrast 3 mm slices were carried out through the heart. The data set was analyzed on a dedicated work station and scored  using the Agatson method.  FINDINGS: Non-cardiac: See separate report from [facility].  Ascending Aorta: Normal size, minimal focal calcium in the aortic root.  Pericardium: Normal.  Coronary arteries: Normal origin.  IMPRESSION: Coronary calcium score of 0. This was 0 percentile for age and sex matched control.   Electronically Signed By: Tobias Alexander On: 06/11/2018 13:51  Addendum 06/11/2018  1:54 PM ADDENDUM REPORT: 06/11/2018 13:51  CLINICAL DATA:  Risk stratification  EXAM: Coronary Calcium Score  TECHNIQUE: The patient was scanned on a [employer] scanner. Axial non-contrast 3 mm slices were carried out through the heart. The data set was analyzed on a dedicated work station and scored using the Agatson method.  FINDINGS: Non-cardiac: See separate report from [facility].  Ascending Aorta: Normal size, minimal focal calcium in the aortic root.  Pericardium: Normal.  Coronary arteries: Normal origin.  IMPRESSION: Coronary calcium score of 0. This was 0 percentile for age and sex matched control.   Electronically Signed By: Tobias Alexander On: 06/11/2018 13:51  Narrative EXAM: OVER-READ INTERPRETATION  CT CHEST  The following report is an over-read performed by radiologist Dr. Genevive Bi of [facility] on 06/11/2018. This over-read does not include interpretation of cardiac or coronary anatomy or pathology. The calcium score interpretation by the cardiologist is attached.  COMPARISON:  None.  FINDINGS: Limited view of the lung parenchyma demonstrates no suspicious nodularity. Airways are normal.  Limited view of the mediastinum demonstrates no adenopathy. Esophagus normal.  Limited view of the upper abdomen unremarkable.  Limited view of the skeleton and chest wall is unremarkable.  IMPRESSION: No significant extracardiac findings  Electronically Signed: By: Genevive Bi M.D. On:  06/11/2018 10:17          Recent Labs: No results found for requested labs within last 365 days.   Lipid Panel No results found for: "CHOL", "TRIG", "HDL", "CHOLHDL", "VLDL", "LDLCALC", "LDLDIRECT"   Additional studies/ records that were reviewed today include:  none    ASSESSMENT:    1. Chest pain of uncertain etiology   2. Nonrheumatic aortic valve insufficiency   3. Palpitations      PLAN:  In order of problems listed above:  Chest pain -she only had 1 episode of CP that occurred at rest -her EKG today is abnormal and concerning for possible LAD disease with lateral precordial ischemia -I will get a coronary CTA to define coronary anatomy  2.  Aortic insufficiency -Noted on echo in 2019 -Will repeat 2D echo to make sure this is stable  3.  Palpitations -she was wearing a heart rate monitor recently that showed an irregular HB -she has documented episodes of HR in the 115bpm range feeling like her heart is pounding -I will get a 2 week Ziopatch  4.  Labile blood pressure -She has been having some problems with low blood pressure readings at times -orthostatic blood pressures today were normal and blood pressure actually elevated and she tells me that she does have whitecoat syndrome -I am suspicious that her blood pressure cuff at home is not working correctly so I am to have her wear a 24-hour blood pressure cuff from our office  Time Spent: 20 minutes total time of encounter, including 15 minutes spent in face-to-face patient care on the date of this encounter. This time includes coordination of care and counseling regarding above mentioned problem list. Remainder of non-face-to-face time involved reviewing chart documents/testing relevant to the patient encounter and documentation in the medical record. I have independently reviewed documentation from referring provider  Followup:  1 year  Medication Adjustments/Labs and Tests Ordered: Current medicines are  reviewed at length with the patient today.  Concerns regarding medicines are outlined above.  Medication changes, Labs and Tests ordered today are listed in the Patient Instructions below.  There are no Patient Instructions on file for this visit.   Signed, Armanda Magic, MD  03/05/2023 10:21 AM    [facility] [address] [place], [place], [place]  [address] Phone: [phone]; Fax: [phone]

## 2023-03-05 NOTE — Addendum Note (Signed)
Addended by: Luellen Pucker on: 03/05/2023 10:36 AM   Modules accepted: Orders

## 2023-03-05 NOTE — Patient Instructions (Addendum)
Medication Instructions:  Dr. Mayford Knifeurner has ordered a one time dose of metoprolol that you will need to take prior to having your coronary CTA exam/imaging. Please read all directions related to your coronary CTA exam/imaging.  *If you need a refill on your cardiac medications before your next appointment, please call your pharmacy*   Lab Work: Dr. Mayford Knifeurner has ordered a 24 hour blood pressure monitor for you. Someone from our office will call you to coordinate delivery of this device. This device checks your blood pressure over time at home to give a better idea of what your blood pressures are when you are in your normal routine.   Please complete a BMET in our lab today before you leave.  If you have labs (blood work) drawn today and your tests are completely normal, you will receive your results only by: MyChart Message (if you have MyChart) OR A paper copy in the mail If you have any lab test that is abnormal or we need to change your treatment, we will call you to review the results.   Testing/Procedures: Your physician has requested that you have an echocardiogram. Echocardiography is a painless test that uses sound waves to create images of your heart. It provides your doctor with information about the size and shape of your heart and how well your heart's chambers and valves are working. This procedure takes approximately one hour. There are no restrictions for this procedure. Please do NOT wear cologne, perfume, aftershave, or lotions (deodorant is allowed). Please arrive 15 minutes prior to your appointment time.   ZIO XT- Long Term Monitor Instructions  Your physician has requested you wear a ZIO patch monitor for 14 days.  This is a single patch monitor. [employer] supplies one patch monitor per enrollment. Additional stickers are not available. Please do not apply patch if you will be having a Nuclear Stress Test,  Echocardiogram, Cardiac CT, MRI, or Chest Xray during the period you  would be wearing the  monitor. The patch cannot be worn during these tests. You cannot remove and re-apply the  ZIO XT patch monitor.  Your ZIO patch monitor will be mailed 3 day USPS to your address on file. It may take 3-5 days  to receive your monitor after you have been enrolled.  Once you have received your monitor, please review the enclosed instructions. Your monitor  has already been registered assigning a specific monitor serial # to you.  Billing and Patient Assistance Program Information  We have supplied [employer] with any of your insurance information on file for billing purposes. [employer] offers a sliding scale Patient Assistance Program for patients that do not have  insurance, or whose insurance does not completely cover the cost of the ZIO monitor.  You must apply for the Patient Assistance Program to qualify for this discounted rate.  To apply, please call [employer] at [phone], select option 4, select option 2, ask to apply for  Patient Assistance Program. Meredeth [employer] will ask your household income, and how many people  are in your household. They will quote your out-of-pocket cost based on that information.  [employer] will also be able to set up a 7668-month, interest-free payment plan if needed.  Applying the monitor   Shave hair from upper left chest.  Hold abrader disc by orange tab. Rub abrader in 40 strokes over the upper left chest as  indicated in your monitor instructions.  Clean area with 4 enclosed alcohol pads. Let dry.  Apply patch as indicated  in monitor instructions. Patch will be placed under collarbone on left  side of chest with arrow pointing upward.  Rub patch adhesive wings for 2 minutes. Remove white label marked "1". Remove the white  label marked "2". Rub patch adhesive wings for 2 additional minutes.  While looking in a mirror, press and release button in center of patch. A small green light will  flash 3-4 times. This will be your only indicator that  the monitor has been turned on.  Do not shower for the first 24 hours. You may shower after the first 24 hours.  Press the button if you feel a symptom. You will hear a small click. Record Date, Time and  Symptom in the Patient Logbook.  When you are ready to remove the patch, follow instructions on the last 2 pages of Patient  Logbook. Stick patch monitor onto the last page of Patient Logbook.  Place Patient Logbook in the blue and white box. Use locking tab on box and tape box closed  securely. The blue and white box has prepaid postage on it. Please place it in the mailbox as  soon as possible. Your physician should have your test results approximately 7 days after the  monitor has been mailed back to [facility].  Call [facility] Customer Care at [phone] if you have questions regarding  your ZIO XT patch monitor. Call them immediately if you see an orange light blinking on your  monitor.  If your monitor falls off in less than 4 days, contact our Monitor department at [phone].  If your monitor becomes loose or falls off after 4 days call [employer] at [phone] for  suggestions on securing your monitor    Your cardiac CT will be scheduled at:   [facility] [address] [place], [place] [address] [phone]     Please arrive at the [facility] (Entrance C2) of [facility] 30 minutes prior to test start time. You can use the FREE valet parking offered at entrance C (encouraged to control the heart rate for the test)  Proceed to the [facility] Radiology Department (first floor) to check-in and test prep.  All radiology patients and guests should use entrance C2 at [facility], accessed from [facility], even though the hospital's physical address listed is [address]       Please follow these instructions carefully (unless otherwise directed):    On the Night Before the  Test: Be sure to Drink plenty of water. Do not consume any caffeinated/decaffeinated beverages or chocolate 12 hours prior to your test. Do not take any antihistamines 12 hours prior to your test. If the patient has contrast allergy: Patient will need a prescription for Prednisone and very clear instructions (as follows): Prednisone 50 mg - take 13 hours prior to test Take another Prednisone 50 mg 7 hours prior to test Take another Prednisone 50 mg 1 hour prior to test Take Benadryl 50 mg 1 hour prior to test Patient must complete all four doses of above prophylactic medications. Patient will need a ride after test due to Benadryl.  On the Day of the Test: Drink plenty of water until 1 hour prior to the test. Do not eat any food 1 hour prior to test. You may take your regular medications prior to the test.  Take metoprolol (Lopressor) two hours prior to test. FEMALES- please wear underwire-free bra if available, avoid dresses & tight clothing  After the Test: Drink plenty of water. After receiving IV contrast, you may experience a mild flushed feeling. This is normal. On occasion, you may experience a mild rash up to 24 hours after the test. This is not dangerous. If this occurs, you can take Benadryl 25 mg and increase your fluid intake. If you experience trouble breathing, this can be serious. If it is severe call 911 IMMEDIATELY. If it is mild, please call our office. If you take any of these medications: Glipizide/Metformin, Avandament, Glucavance, please do not take 48 hours after completing test unless otherwise instructed.  We will call to schedule your test 2-4 weeks out understanding that some insurance companies will need an authorization prior to the service being performed.   For non-scheduling related questions, please contact the cardiac imaging nurse navigator should you have any questions/concerns: Rockwell Alexandria, Cardiac Imaging Nurse Navigator Larey Brick,  Cardiac Imaging Nurse Navigator [facility] Direct Office Dial: [phone]   For scheduling needs, including cancellations and rescheduling, please call [place], [phone].    Follow-Up: At [facility], you and your health needs are our priority.  As part of our continuing mission to provide you with exceptional heart care, we have created designated Provider Care Teams.  These Care Teams include your primary Cardiologist (physician) and Advanced Practice Providers (APPs -  Physician Assistants and Nurse Practitioners) who all work together to provide you with the care you need, when you need it.  We recommend signing up for the patient portal called "MyChart".  Sign up information is provided on this After Visit Summary.  MyChart is used to connect with patients for Virtual Visits (Telemedicine).  Patients are able to view lab/test results, encounter notes, upcoming appointments, etc.  Non-urgent messages can be sent to your provider as well.   To learn more about what you can do with MyChart, go to [email or web address].    Your next appointment:   1 year(s)  Provider:   Dr. Armanda Magic, MD

## 2023-03-05 NOTE — Addendum Note (Signed)
Addended by: Luellen Pucker on: 03/05/2023 11:46 AM   Modules accepted: Orders

## 2023-03-06 ENCOUNTER — Telehealth: Payer: Self-pay

## 2023-03-06 LAB — BASIC METABOLIC PANEL
BUN/Creatinine Ratio: 42 — ABNORMAL HIGH (ref 12–28)
BUN: 22 mg/dL (ref 8–27)
CO2: 27 mmol/L (ref 20–29)
Calcium: 10 mg/dL (ref 8.7–10.3)
Chloride: 101 mmol/L (ref 96–106)
Creatinine, Ser: 0.52 mg/dL — ABNORMAL LOW (ref 0.57–1.00)
Glucose: 99 mg/dL (ref 70–99)
Potassium: 4.3 mmol/L (ref 3.5–5.2)
Sodium: 142 mmol/L (ref 134–144)
eGFR: 102 mL/min/{1.73_m2} (ref >59)

## 2023-03-06 NOTE — Telephone Encounter (Signed)
Called patient to advise of normal labs, patient verbalizing understanding.

## 2023-03-06 NOTE — Telephone Encounter (Signed)
-----   Message from Loa Socks, LPN sent at 3/88/8280  9:24 AM EDT -----  ----- Message ----- From: Christell Constant, MD Sent: 03/06/2023   9:24 AM EDT To: Quintella Reichert, MD; Cv Div Ch St Triage  Ended up in my basket by mistake. BMP for CCTA- no barriers to getting study. ----- Message ----- From: Nell Range Lab Results In Sent: 03/06/2023   1:35 AM EDT To: Christell Constant, MD

## 2023-03-07 DIAGNOSIS — R002 Palpitations: Secondary | ICD-10-CM

## 2023-03-07 DIAGNOSIS — R079 Chest pain, unspecified: Secondary | ICD-10-CM | POA: Diagnosis not present

## 2023-03-10 ENCOUNTER — Telehealth (HOSPITAL_COMMUNITY): Payer: Self-pay | Admitting: Emergency Medicine

## 2023-03-10 NOTE — Telephone Encounter (Signed)
Reaching out to patient to offer assistance regarding upcoming cardiac imaging study; pt verbalizes understanding of appt date/time, parking situation and where to check in, pre-test NPO status and medications ordered, and verified current allergies; name and call back number provided for further questions should they arise Rockwell Alexandria RN Navigator Cardiac Imaging [facility] [phone] office [phone] cell  Arrival 730  Wearing heart monitor She's had contrast media before she remembers getting warm sensation but no negative reaction.  100mg  metoprolol tartrate  Denies iv issues Aware contrast/nitro

## 2023-03-11 ENCOUNTER — Ambulatory Visit (HOSPITAL_COMMUNITY): Payer: Commercial Managed Care - PPO

## 2023-03-19 ENCOUNTER — Telehealth (HOSPITAL_COMMUNITY): Payer: Self-pay | Admitting: Emergency Medicine

## 2023-03-19 NOTE — Telephone Encounter (Signed)
Reaching out to patient to offer assistance regarding upcoming cardiac imaging study; pt verbalizes understanding of appt date/time, parking situation and where to check in, pre-test NPO status and medications ordered, and verified current allergies; name and call back number provided for further questions should they arise Rockwell Alexandria RN Navigator Cardiac Imaging [facility] [phone] office [phone] cell  Denies IV contrast allergy  metoprolol tartrate Denies iv issues

## 2023-03-20 ENCOUNTER — Ambulatory Visit (HOSPITAL_COMMUNITY)
Admission: RE | Admit: 2023-03-20 | Discharge: 2023-03-20 | Disposition: A | Discharge status: Home / Self Care | Payer: Commercial Managed Care - PPO | Source: Ambulatory Visit | Attending: Internal Medicine | Admitting: Internal Medicine

## 2023-03-20 DIAGNOSIS — R079 Chest pain, unspecified: Secondary | ICD-10-CM | POA: Insufficient documentation

## 2023-03-20 DIAGNOSIS — R0602 Shortness of breath: Secondary | ICD-10-CM | POA: Insufficient documentation

## 2023-03-20 DIAGNOSIS — R072 Precordial pain: Secondary | ICD-10-CM

## 2023-03-20 MED ORDER — IOHEXOL 350 MG/ML SOLN
100.0000 mL | Freq: Once | INTRAVENOUS | Status: AC | PRN
  Administered 2023-03-20: 100 mL via INTRAVENOUS

## 2023-03-20 MED ORDER — NITROGLYCERIN 0.4 MG SL SUBL
SUBLINGUAL_TABLET | SUBLINGUAL | Status: AC
  Filled 2023-03-20: qty 2

## 2023-03-20 MED ORDER — NITROGLYCERIN 0.4 MG SL SUBL
0.8000 mg | SUBLINGUAL_TABLET | Freq: Once | SUBLINGUAL | Status: AC
  Administered 2023-03-20: 0.8 mg via SUBLINGUAL

## 2023-03-21 ENCOUNTER — Encounter: Payer: Self-pay | Admitting: Cardiology

## 2023-03-23 ENCOUNTER — Encounter: Payer: Self-pay | Admitting: Cardiology

## 2023-03-23 DIAGNOSIS — I2510 Atherosclerotic heart disease of native coronary artery without angina pectoris: Secondary | ICD-10-CM | POA: Insufficient documentation

## 2023-03-24 ENCOUNTER — Telehealth: Payer: Self-pay | Admitting: Cardiology

## 2023-03-24 NOTE — Telephone Encounter (Signed)
Patient called asking for Dr. Mayford Knife to explain her CT test results. Patient mentioned her blood pressure is low. This morning bp was 122/68. Will forward to MD and nurse for advise.

## 2023-03-24 NOTE — Telephone Encounter (Signed)
Pt calling for ct results

## 2023-03-24 NOTE — Telephone Encounter (Signed)
Spoke to the patient, per Dr Mayford Knife:  She has very minimal plaque in her coronary arteries with no blocked arteries   Patient voiced understanding

## 2023-03-27 ENCOUNTER — Ambulatory Visit (HOSPITAL_COMMUNITY): Payer: Commercial Managed Care - PPO | Attending: Cardiovascular Disease

## 2023-03-27 DIAGNOSIS — I351 Nonrheumatic aortic (valve) insufficiency: Secondary | ICD-10-CM | POA: Diagnosis present

## 2023-03-28 ENCOUNTER — Other Ambulatory Visit: Payer: Self-pay

## 2023-03-28 ENCOUNTER — Encounter: Payer: Self-pay | Admitting: Cardiology

## 2023-03-28 DIAGNOSIS — I359 Nonrheumatic aortic valve disorder, unspecified: Secondary | ICD-10-CM

## 2023-03-28 LAB — ECHOCARDIOGRAM COMPLETE
Area-P 1/2: 3.19 cm2
P 1/2 time: 303 msec
S' Lateral: 2.3 cm

## 2023-03-31 ENCOUNTER — Telehealth: Payer: Self-pay

## 2023-03-31 MED ORDER — METOPROLOL SUCCINATE ER 25 MG PO TB24: 25.0000 mg | ORAL_TABLET | Freq: Every day | ORAL | 3 refills | Status: DC

## 2023-03-31 NOTE — Telephone Encounter (Signed)
Called patient to discuss Coronary calcium score results as well as heart monitor results. Patient verbalizes understanding of short bursts of SVT. Patient agrees to start Toprol XL, order placed to preferred pharmacy. Education provided on how to keep BP log and to call our office with results in one week.   Explained that patient has minimal calcified and uncalcified plaque in vessels of heart and Dr. Mayford Knife advises to start aspirin 81 mg. Patient states she has to take aleve every day for arthritis and when she has tried in the past to change to aspirin, it did not alleviate her arthritis pain very well. She states she doesn't feel comfortable taking both. She is asking if there is another option for lowering her cardiac risks.   Patient also states she did an FLP and ALT in February and is asking why she needs to repeat them. Patient responses forwarded to Dr. Mayford Knife.

## 2023-03-31 NOTE — Telephone Encounter (Signed)
-----   Message from Quintella Reichert, MD sent at 03/23/2023  7:34 PM EDT ----- Minimal calcified plaque in the proximal LAD and noncalcified plaque in the RCA <25%  Start ASA 81mg  daily and get an FLP and ALT

## 2023-04-04 NOTE — Telephone Encounter (Signed)
Called and left detailed message per DPR asking patient to call office to discuss starting atorvastatin.

## 2023-04-08 NOTE — Telephone Encounter (Signed)
The patient has been notified of the result and verbalized understanding.  All questions (if any) were answered. Asencion Gowda, LPN 1/61/0960 45:40 AM

## 2023-04-08 NOTE — Telephone Encounter (Signed)
Patient is returning call and is requesting call back.   With whole pill:  05/08 - 8:00 A.M. - 118/73 BP   HR 76              3:30 P.M. - 115/69  BP   HR 72             9:00 P.M. -   125/71 BP  HR 75      Changed to half pill: 05/09 - 8:15 A.M. - 106/65 BP   HR 71             3:00 P.M.-  123/68 BP   HR 71             9:30 P.M. - 114/62 BP    HR 71  05/10 - 7:40 A.M. - 114/61 BP   HR 70              2:00 P.M. - 100/58 BP  HR 74    6:40 P.M. -  118/68 BP  HR 77  Cut pill to a quarter of half:  05/11 -  9:00 A.M. -  117/62 BP  HR 71              10:45 A.M. - 103/60 BP  HR 75    12 :15 P.M. - 107/62   HR 73               4:30 P.M.  -  120/65   HR 76     9:30 P.M.  - 127/67 HR 71  05/12 - 8:00 A.M. - 109/70 BP   HR 73   1:30 P.M. - 121/62 BP    HR 69  7:30 P.M. - 109/63 BP    HR 79  05/13 - 7:30 A.M. - 118/73 BP  HR 77  5:00 P.M. - 118/72  BP  HR 68  9:30 P.M. -104/63   BP HR 72  05/14 - 7:30 A.M. - 113/67 BP  HR 75

## 2023-04-10 ENCOUNTER — Telehealth: Payer: Self-pay

## 2023-04-10 NOTE — Telephone Encounter (Signed)
Called patient to discuss Dr. Norris Cross recommendations after reviewing cholesterol labs sent from outside lab. Dr. Mayford Knife noting LDL goal is <70, advises to start atorvastatin 10 mg daily and repeat FLP/ALT and Lp(a) in 6 weeks. Previous call to patient on 04/04/23. No answer today, left message asking recipient to call office.

## 2023-04-11 NOTE — Telephone Encounter (Signed)
Call transferred into triage.  Pt returned call to [facility] regarding statin recommendation.    The patient states she is not taking the statin yet so don't even call that in.  She is working on her diet and was told not to exercise during this work up she's had and has not been told yet that she can resume and feels that may help.  In addition, she has not taken the Toprol in 2 days because it is making her BP too low.  This am off the med BP was 108/62, 82.  Yesterday 90/50s.  She said she kept cutting it back to 1/2 and then 1/4 tab.  She continued to have the weakness, lightheadedness and nausea while taking it.  She has NOT been getting the squeezing feelings in her chest while on Toprol though.    We talked about her monitor and the SVT episodes and she is aware I will let Dr. Mayford Knife know all of this information.  She wants to not try anything else for at least a week to let the medication get out of her system and see how she's feeling.  She was going through a stressful time and now is not.  She doesn't want to be prescribed anything that may interfere w her being able to use Aleve for arthritis.

## 2023-04-17 ENCOUNTER — Ambulatory Visit: Payer: Commercial Managed Care - PPO | Admitting: Cardiology

## 2023-04-17 ENCOUNTER — Encounter: Payer: Self-pay | Admitting: Cardiology

## 2023-04-17 VITALS — BP 151/89 | HR 89 | Ht 64.5 in | Wt 128.4 lb

## 2023-04-17 DIAGNOSIS — R030 Elevated blood-pressure reading, without diagnosis of hypertension: Secondary | ICD-10-CM

## 2023-04-17 DIAGNOSIS — R9431 Abnormal electrocardiogram [ECG] [EKG]: Secondary | ICD-10-CM

## 2023-04-17 DIAGNOSIS — R002 Palpitations: Secondary | ICD-10-CM

## 2023-04-17 DIAGNOSIS — I351 Nonrheumatic aortic (valve) insufficiency: Secondary | ICD-10-CM

## 2023-04-17 NOTE — Progress Notes (Signed)
Primary Physician/Referring:  April Manson, NP  Patient ID: Rachel Fleming, female    DOB: 21-Oct-1956, 67 y.o.   MRN: [id number]  Chief Complaint  Patient presents with   aortic valve disorder   New Patient (Initial Visit)   Chest Pain   HPI:    Rachel Fleming  is a 67 y.o. Caucasian female presents to establish cardiac care, was evaluated by Dr. Carolanne Grumbling recently.  Patient with chest pain, palpitations and not feeling well that started in March 2024 associated with abnormal EKG.  Underwent extensive evaluation.  She still has occasional sharp chest pain across the chest lasting a few seconds, rare episodes of palpitations for a few seconds mostly at rest.  Otherwise remains asymptomatic with no dizziness or syncope or leg edema.  Past Medical History:  Diagnosis Date   Aortic valve regurgitation 2019   mild to moderate echo 03/2023   Arthritis    hands, hips, neck - otc med prn   CAD (coronary artery disease), native coronary artery    Minimal calcified plaque in the proximal LAD and noncalcified plaque in the RCA <25%   Dry eyes due to decreased tear production    History of kidney stones 2017   passed stone no surgery required   PONV (postoperative nausea and vomiting)    scop patch, zofran & decadron works great for patient - see 07/09/18 anesthesia event   Past Surgical History:  Procedure Laterality Date   ABDOMINAL HYSTERECTOMY  2004   ABDOMINAL SURGERY  1996   mass remaved   COLONOSCOPY     normal   INCISIONAL [facility]ERN[facility] REPAIR  [facility]/06/2018   [facility]    LAPAROSCOPIC BILATERAL SALPINGO OOPHERECTOMY Bilateral 07/09/2018   Procedure: LAPAROSCOPIC BILATERAL SALPINGO OOPHORECTOMY, cystocopy;  Surgeon: Richardean Chimera, MD;  Location: [facility];  Service: Gynecology;  Laterality: Bilateral;   SALPINGOOPHORECTOMY Bilateral 06/2018   SPLENECTOMY, TOTAL  1996   TOTAL HIP ARTHROPLASTY Right 12/03/2017   Procedure: RIGHT TOTAL HIP ARTHROPLASTY  ANTERIOR APPROACH;  Surgeon: Ollen Gross, MD;  Location: [facility];  Service: Orthopedics;  Laterality: Right;   TOTAL HIP ARTHROPLASTY Left 09/28/2018   Procedure: LEFT TOTAL HIP ARTHROPLASTY ANTERIOR APPROACH;  Surgeon: Ollen Gross, MD;  Location: [facility];  Service: Orthopedics;  Laterality: Left;    TUBAL LIGATION N/A 08/06/2018   Procedure: REPAIR OF INCISIONAL HERNIA;  Surgeon: Richardean Chimera, MD;  Location: [facility];  Service: Gynecology;  Laterality: N/A;   WISDOM TOOTH EXTRACTION     Family History  Problem Relation Age of Onset   Gallstones Mother    High Cholesterol Father    Kidney Stones Father    Cancer Maternal Uncle    Ovarian cancer Maternal Grandmother    Bone cancer Maternal Grandfather    Leukemia Maternal Grandfather    Heart disease Paternal Grandmother    Arthritis Paternal Grandmother     Social History   Tobacco Use   Smoking status: Former    Packs/day: 0.25    Years: 8.00    Additional pack years: 0.00    Total pack years: 2.00    Types: Cigarettes   Smokeless tobacco: Never   Tobacco comments:    quit at 26   Substance Use Topics   Alcohol use: No   Marital Status: Married  ROS  Review of Systems  Cardiovascular:  Positive for chest pain and palpitations. Negative for dyspnea on exertion and leg swelling.   Objective  04/17/2023    1:26 PM 04/17/2023    1:14 PM 03/20/2023    8:18 AM  Vitals with BMI  Height  5' 4.5"   Weight  128 lbs 6 oz   BMI  21.71   Systolic 151 148 96  Diastolic 89 87 60  Pulse 89 84    SpO2: 98 %  Physical Exam Neck:     Vascular: No carotid bruit or JVD.  Cardiovascular:     Rate and Rhythm: Normal rate and regular rhythm.     Pulses: Intact distal pulses.     Heart sounds: Murmur heard.     Midsystolic murmur is present with a grade of 2/6 at the upper right sternal border.     No gallop.  Pulmonary:     Effort: Pulmonary effort is normal.     Breath sounds: Normal breath sounds.  Abdominal:      General: Bowel sounds are normal.     Palpations: Abdomen is soft.  Musculoskeletal:     Right lower leg: No edema.     Left lower leg: No edema.     Laboratory examination:   Recent Labs    03/05/23 1119  NA 142  K 4.3  CL 101  CO2 27  GLUCOSE 99  BUN 22  CREATININE 0.52*  CALCIUM 10.0    Lab Results  Component Value Date   GLUCOSE 99 03/05/2023   NA 142 03/05/2023   K 4.3 03/05/2023   CL 101 03/05/2023   CO2 27 03/05/2023   BUN 22 03/05/2023   CREATININE 0.52 (L) 03/05/2023   EGFR 102 03/05/2023   CALCIUM 10.0 03/05/2023   PROT 6.9 09/21/2018   ALBUMIN 4.1 09/21/2018   BILITOT 1.1 09/21/2018   ALKPHOS 81 09/21/2018   AST 22 09/21/2018   ALT 16 09/21/2018   ANIONGAP 6 09/29/2018      Lab Results  Component Value Date   ALT 16 09/21/2018   AST 22 09/21/2018   ALKPHOS 81 09/21/2018   BILITOT 1.1 09/21/2018       Latest Ref Rng & Units 09/21/2018    9:11 AM 11/27/2017    9:01 AM 03/20/2016    9:12 PM  Hepatic Function  Total Protein 6.5 - 8.1 g/dL 6.9  7.1  7.1   Albumin 3.5 - 5.0 g/dL 4.1  4.0  4.4   AST 15 - 41 U/L 22  27  31    ALT 0 - 44 U/L 16  22  18    Alk Phosphatase 38 - 126 U/L 81  93  83   Total Bilirubin 0.3 - 1.2 mg/dL 1.1  0.9  0.9     External labs:   01/16/2023:  Total cholesterol 195, triglycerides 59, HDL 72, LDL 112.  TSH normal at 0.710.  Serum glucose 91 mg, BUN 19, creatinine 0.47, EGFR 140 mill.  LFTs normal.  Hb 14.0/HCT 41.7, platelets 328.  Radiology:    Cardiac Studies:   Echocardiogram 03/27/2023: 1. Left ventricular ejection fraction, by estimation, is 60 to 65%. Left ventricular ejection fraction by 3D volume is 58 %. The left ventricle has normal function. The left ventricle has no regional wall motion abnormalities. Left ventricular diastolic  parameters are consistent with Grade I diastolic dysfunction (impaired relaxation). The average left ventricular global longitudinal strain is -25.2 %. The global  longitudinal strain is normal.  2. Right ventricular systolic function is normal. The right ventricular size is normal.  3. Prominent Crista terminalis.  4.  The mitral valve is abnormal. No evidence of mitral valve regurgitation.  5. The aortic valve is tricuspid. Aortic valve regurgitation is mild to moderate. Aortic valve sclerosis is present, with no evidence of aortic valve stenosis.  Outpatient extended EKG monitoring (Zio patch) 12 days starting 03/07/2023:   Patient had a minimum heart rate of 54 bpm, maximum heart rate of 240 bpm, and average heart rate of 86 bpm.   Predominant underlying rhythm was sinus rhythm.   Short runs of SVT, largest 14 seconds.   Isolated PACs were rare (<1.0%).   Isolated PVCs were rare (<1.0%).   Triggered and diary events associated with sinus rhythm, sinus tachycardia, and PACs.  Coronary CTA 03/20/2023: 1. Normal ascending thoracic aorta 3.5 cm 2.  CAD RADS 1 non obstructive CAD, 1 to 24% stenosis proximal LAD.  3. Calcium score registers as 0 although very small nidus of calcium seen in proximal LAD 4. Aortic valve is tri leaflet with mildly dilated left sinus 2.8 cm relative to non and right sinus  EKG:   EKG 04/17/2023: Normal sinus rhythm with rate of 74 bpm, poor R wave progression, cannot exclude anteroseptal infarct old.  Compared to the 03/05/2023, lateral deep T wave inversion in V4 to V6 suggestive of subendocardial ischemia or infarct no longer present.    EKG 05/18/2018: Normal sinus rhythm with rate of 70 bpm.  Medications and allergies   Allergies  Allergen Reactions   Povidone Iodine Other (See Comments) and Anxiety    Anxiety,  Makes her climb the walls     Medication list   Current Outpatient Medications:    Multiple Vitamin (MULTIVITAMIN) tablet, Take 1 tablet by mouth daily., Disp: , Rfl:    naproxen sodium (ALEVE) 220 MG tablet, Take 220 mg by mouth daily., Disp: , Rfl:    nitroGLYCERIN (NITROSTAT) 0.4 MG SL tablet, Place  0.4 mg under the tongue every 5 (five) minutes as needed., Disp: , Rfl:    OVER THE COUNTER MEDICATION, Take 3 tablets by mouth daily. Instaflex Joint supplement, Disp: , Rfl:    OVER THE COUNTER MEDICATION, Apply 1 application topically daily as needed (for arthritis pain). Deep Freeze Cream, Disp: , Rfl:    Polyethyl Glycol-Propyl Glycol (SYSTANE) 0.4-0.3 % SOLN, Place 1 drop into both eyes 3 (three) times daily as needed (for dry eyes). , Disp: , Rfl:   Assessment     ICD-10-CM   1. Nonrheumatic aortic valve insufficiency  I35.1 EKG 12-Lead    2. Nonspecific abnormal electrocardiogram (ECG) (EKG)  R94.31     3. Elevated BP without diagnosis of hypertension  R03.0     4. Palpitations  R00.2        Orders Placed This Encounter  Procedures   EKG 12-Lead    No orders of the defined types were placed in this encounter.   Medications Discontinued During This Encounter  Medication Reason   metoprolol succinate (TOPROL XL) 25 MG 24 hr tablet Side effect (s)   metoprolol tartrate (LOPRESSOR) 100 MG tablet Side effect (s)     Recommendations:   Rachel Fleming is a 67 y.o. Caucasian female presents to establish cardiac care, was evaluated by Dr. Carolanne Grumbling recently.  Patient with chest pain, palpitations and not feeling well that started in March 2024 associated with abnormal EKG.  Underwent extensive evaluation.  1. Nonrheumatic aortic valve insufficiency Patient has known mild aortic valve regurgitation noted in 2019.  Present echocardiogram reviewed mild to moderate aortic regurgitation,  slight progression.  Auscultation and pulse pressure does not suggest any more than mild to moderate aortic regurgitation.  She remains asymptomatic.  - EKG 12-Lead  2. Nonspecific abnormal electrocardiogram (ECG) (EKG) Her symptoms of chest pain and palpitations have essentially resolved and I suspect her abnormal EKG was probably related to focal pericarditis.  EKG is not completely back to  baseline normal and EKG no today appears to be same as in 2019.  Hence I reassured her.  For now watchful observation is indicated.  EKG in March 2024 was markedly abnormal with deep T wave inversion in the lateral leads.  I also reviewed her coronary CTA, she has no significant coronary disease.  No indication for aspirin.  Her coronary calcium score is 0.  Her LDL is mildly elevated but she prefers not to be on any statin therapy for now.  In view of low coronary calcium score, absence of known hypertension, we could certainly trend her LDL.  3. Elevated BP without diagnosis of hypertension She may have hidden hypertension.  Blood pressure was elevated today.  She will continue to monitor this closely and advised her that if SBP >130/80 mmHg, she should be on therapy, ACE inhibitors would be my first choice.  4. Palpitations She has had occasional episodes of palpitations, reviewed her Zio patch monitoring which reveals brief atrial tachycardia and PACs that were symptomatic but not atrial tachycardia.  At this time I have reassured her.  This was a >60-minute office visit encounter in review of all her tests and personal review of her images and EKGs, discussion with the patient and reassurance.  I will see her back in 6 months and if she remains stable on a annual to every 2-year basis for follow-up of aortic regurgitation.   Rachel Decamp, MD, [facility] 04/17/2023, 5:52 PM Office: [phone]

## 2023-04-23 ENCOUNTER — Telehealth: Payer: Self-pay | Admitting: *Deleted

## 2023-04-23 NOTE — Telephone Encounter (Signed)
Patient unable to schedule 24 hour ambulatory blood pressure monitor at this time. She will call us back to schedule when she can work it out.

## 2023-04-25 NOTE — Telephone Encounter (Signed)
Called patient to see if she had reconsidered starting lipitor as advised by Dr. Mayford Knife. Patient states she wants  to try and manage her cholesterol with diet and exercise. 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 She states she is getting on Medicare and she will arrange w/ her PCP to retest cholesterol levels.   Patient also reports that she is no longer taking toprol as it made her feel fatigued, but she also has not had further episodes of SVT/chest discomfort. She states she saw Dr. Jacinto Halim for a second opinion and he feels it may have been a focal pericarditis which has improved. Patient states she will call us if she feels she needs any further assistance with her cholesterol labs or with any cardiac symptoms. Advised patient that cholesterol labs can worsen without intervention and asked if she wanted [place] to go ahead and schedule some future labs for her, patient declines at this time.

## 2023-11-21 ENCOUNTER — Encounter: Payer: Self-pay | Admitting: Cardiology

## 2023-11-21 ENCOUNTER — Ambulatory Visit: Payer: Medicare Other | Attending: Cardiology | Admitting: Cardiology

## 2023-11-21 VITALS — BP 136/84 | HR 98 | Resp 16 | Ht 64.0 in | Wt 124.0 lb

## 2023-11-21 DIAGNOSIS — R002 Palpitations: Secondary | ICD-10-CM | POA: Insufficient documentation

## 2023-11-21 DIAGNOSIS — I351 Nonrheumatic aortic (valve) insufficiency: Secondary | ICD-10-CM | POA: Insufficient documentation

## 2023-11-21 NOTE — Patient Instructions (Signed)
Follow-Up: At [facility], you and your health needs are our priority.  As part of our continuing mission to provide you with exceptional heart care, we have created designated Provider Care Teams.  These Care Teams include your primary Cardiologist (physician) and Advanced Practice Providers (APPs -  Physician Assistants and Nurse Practitioners) who all work together to provide you with the care you need, when you need it.  Your next appointment:   As Needed  Provider:   Yates Decamp, MD

## 2023-11-21 NOTE — Progress Notes (Unsigned)
Cardiology Office Note:  .   Date:  11/22/2023  ID:  OAKLAND COURTEMANCHE, DOB [birth year], MRN [id number] PCP: April Manson, NP  [facility] Providers Cardiologist:  Yates Decamp, MD   History of Present Illness: .   Rachel Fleming is a 67 y.o.  Caucasian female with chest pain, palpitations and not feeling well that started in March 2024 associated with abnormal EKG.  Underwent extensive evaluation.  She was seen by me 6 months ago, she now presents for follow-up.  She has not had any further recurrence of chest pain or palpitation and states that she is feeling the best she has in quite a while. Otherwise remains asymptomatic with no dizziness or syncope or leg edema.  Discussed the use of AI scribe software for clinical note transcription with the patient, who gave verbal consent to proceed.  History of Present Illness   The patient, with a history of pericarditis and hyperlipidemia, presents for a follow-up visit. She reports feeling 'very well' and has not experienced any chest pain or palpitations. She has been monitoring her blood pressure at home and has not noticed any concerning changes. She has been physically active and has not experienced any symptoms of shortness of breath or heart racing.  Recently, she experienced a two-week illness characterized by a sore throat, fever, and congestion. During this time, she noticed an increase in her heart rate, which she attributed to her body fighting off the illness. She reports that she is now '99% better,' with only an occasional dry cough remaining.  The patient is health-conscious and has been making dietary changes to manage her slightly elevated cholesterol levels. She reports trying to avoid foods high in cholesterol and practicing moderation in her diet.      Review of Systems  Cardiovascular:  Negative for chest pain, dyspnea on exertion and leg swelling.   Blood pressure 136/84, pulse 98, resp. rate 16, height 5\' 4"  (1.626 m),  weight 124 lb (56.2 kg), SpO2 96%.  Wt Readings from Last 3 Encounters:  11/21/23 124 lb (56.2 kg)  04/17/23 128 lb 6.4 oz (58.2 kg)  03/05/23 130 lb (59 kg)    Physical Exam Neck:     Vascular: No carotid bruit or JVD.  Cardiovascular:     Rate and Rhythm: Normal rate and regular rhythm.     Pulses: Intact distal pulses.     Heart sounds: Normal heart sounds. No murmur heard.    No gallop.  Pulmonary:     Effort: Pulmonary effort is normal.     Breath sounds: Normal breath sounds.  Abdominal:     General: Bowel sounds are normal.     Palpations: Abdomen is soft.  Musculoskeletal:     Right lower leg: No edema.     Left lower leg: No edema.    Labs   Lab Results  Component Value Date   NA 142 03/05/2023   K 4.3 03/05/2023   CO2 27 03/05/2023   GLUCOSE 99 03/05/2023   BUN 22 03/05/2023   CREATININE 0.52 (L) 03/05/2023   CALCIUM 10.0 03/05/2023   EGFR 102 03/05/2023   GFRNONAA >60 09/29/2018      Latest Ref Rng & Units 03/05/2023   11:19 AM 09/29/2018    4:16 AM 09/21/2018    9:11 AM  BMP  Glucose 70 - 99 mg/dL 99  440  90   BUN 8 - 27 mg/dL 22  13  28    Creatinine 0.57 -  1.00 mg/dL 6.38  7.56  4.33   BUN/Creat Ratio 12 - 28 42     Sodium 134 - 144 mmol/L 142  139  140   Potassium 3.5 - 5.2 mmol/L 4.3  4.1  4.2   Chloride 96 - 106 mmol/L 101  106  107   CO2 20 - 29 mmol/L 27  27  27    Calcium 8.7 - 10.3 mg/dL 29.5  8.7  9.4       Latest Ref Rng & Units 09/29/2018    4:16 AM 09/21/2018    9:11 AM 08/05/2018   11:25 AM  CBC  WBC 4.0 - 10.5 K/uL 12.3  6.6  6.0   Hemoglobin 12.0 - 15.0 g/dL 18.8  41.6  60.6   Hematocrit 36.0 - 46.0 % 33.3  38.6  40.2   Platelets 150 - 400 K/uL 279  320  320    External Labs:  Labs 01/14/2023:  Total cholesterol 195, triglycerides 59, HDL 72, LDL 112. NHDL 122  Serum glucose 91 mg, BUN 19, creatinine 0.47, EGFR 105 mL, potassium 4.0.  Hb 14.0/HCT 41.7, platelets 328.  Normal indicis.  EKG:    EKG 04/17/2023: Normal  sinus rhythm with rate of 74 bpm, poor R wave progression, cannot exclude anteroseptal infarct old.   Medications and allergies    Allergies  Allergen Reactions   Povidone Iodine Other (See Comments) and Anxiety    Anxiety,  Makes her climb the walls     Current Outpatient Medications:    Multiple Vitamin (MULTIVITAMIN) tablet, Take 1 tablet by mouth daily., Disp: , Rfl:    naproxen sodium (ALEVE) 220 MG tablet, Take 220 mg by mouth daily., Disp: , Rfl:    nitroGLYCERIN (NITROSTAT) 0.4 MG SL tablet, Place 0.4 mg under the tongue every 5 (five) minutes as needed., Disp: , Rfl:    OVER THE COUNTER MEDICATION, Take 3 tablets by mouth daily. Instaflex Joint supplement, Disp: , Rfl:    OVER THE COUNTER MEDICATION, Apply 1 application topically daily as needed (for arthritis pain). Deep Freeze Cream, Disp: , Rfl:    Polyethyl Glycol-Propyl Glycol (SYSTANE) 0.4-0.3 % SOLN, Place 1 drop into both eyes 3 (three) times daily as needed (for dry eyes). , Disp: , Rfl:    ASSESSMENT AND PLAN: .      ICD-10-CM   1. Mild aortic regurgitation  I35.1     2. Palpitations  R00.2       Assessment and Plan    Hyperlipidemia LDL slightly elevated at 112, but non-HDL cholesterol within normal limits at 122 due to high HDL. No need for pharmacological intervention. -Continue current lifestyle modifications.  Mild Aortic Regurgitation No symptoms of shortness of breath or significant drop in diastolic blood pressure. No audible murmur on examination. -Continue current lifestyle modifications. -Return for evaluation if symptoms of shortness of breath, heart racing, or significant drop in diastolic blood pressure occur.  Recent Illness Recent upper respiratory infection with associated tachycardia, now resolved. -No further action required at this time.  Follow-up No immediate follow-up needed. Patient to return as needed or for routine check in approximately 5 years.       Signed,  Yates Decamp,  MD, [facility] 11/22/2023, 6:21 AM [facility] [address] [place], [place] [address] Phone: [phone]. Fax:  [phone]

## 2024-02-23 ENCOUNTER — Telehealth (HOSPITAL_COMMUNITY): Payer: Self-pay | Admitting: Cardiology

## 2024-02-23 NOTE — Telephone Encounter (Signed)
 I called to schedule echocardiogram for May oer order from Dr. Mayford Knife. Patient did not wish to schedule and states she no longer sees Dr. Mayford Knife and now following Dr. Nadara Eaton and he stated she didn't need to come back but as needed. Order will be removed from the echo WQ.

## 2024-03-22 ENCOUNTER — Ambulatory Visit
Admission: RE | Admit: 2024-03-22 | Discharge: 2024-03-22 | Disposition: A | Discharge status: Home / Self Care | Source: Ambulatory Visit | Attending: Family Medicine | Admitting: Family Medicine

## 2024-03-22 VITALS — BP 170/80 | HR 95 | Temp 98.6°F | Resp 18

## 2024-03-22 DIAGNOSIS — S91331A Puncture wound without foreign body, right foot, initial encounter: Secondary | ICD-10-CM

## 2024-03-22 DIAGNOSIS — L03115 Cellulitis of right lower limb: Secondary | ICD-10-CM | POA: Diagnosis not present

## 2024-03-22 DIAGNOSIS — Z23 Encounter for immunization: Secondary | ICD-10-CM | POA: Diagnosis not present

## 2024-03-22 MED ORDER — DOXYCYCLINE HYCLATE 100 MG PO CAPS: 100.0000 mg | ORAL_CAPSULE | Freq: Two times a day (BID) | ORAL | 0 refills | Status: AC

## 2024-03-22 MED ORDER — TETANUS-DIPHTH-ACELL PERTUSSIS 5-2.5-18.5 LF-MCG/0.5 IM SUSY
0.5000 mL | PREFILLED_SYRINGE | Freq: Once | INTRAMUSCULAR | Status: AC
  Administered 2024-03-22: 0.5 mL via INTRAMUSCULAR

## 2024-03-22 NOTE — ED Provider Notes (Signed)
 [name]    CSN: [id number] Arrival date & time: 03/22/24  1244      History   Chief Complaint Chief Complaint  Patient presents with   Foot Pain    HPI Rachel Fleming is a 68 y.o. female.   HPI pleasant 68 year old female presents with right foot injury and request Tdap.  PMH significant for CAD, aortic valve regurgitation, and OA of hip.  Past Medical History:  Diagnosis Date   Aortic valve regurgitation 2019   mild to moderate echo 03/2023   Arthritis    hands, hips, neck - otc med prn   CAD (coronary artery disease), native coronary artery    Minimal calcified plaque in the proximal LAD and noncalcified plaque in the RCA <25%   Dry eyes due to decreased tear production    History of kidney stones 2017   passed stone no surgery required   PONV (postoperative nausea and vomiting)    scop patch, zofran  & decadron  works great for patient - see 07/09/18 anesthesia event    Patient Active Problem List   Diagnosis Date Noted   CAD (coronary artery disease), native coronary artery 03/23/2023   OA (osteoarthritis) of hip 12/03/2017   Heart palpitations 12/12/2011   CARDIAC MURMUR 03/06/2010    Past Surgical History:  Procedure Laterality Date   ABDOMINAL HYSTERECTOMY  2004   ABDOMINAL SURGERY  1996   mass remaved   COLONOSCOPY     normal   INCISION[facility]PA[facility]  [facility]06/2018   [facility]    LAPAROSCOPIC BILATERAL SALPINGO OOPHERECTOMY Bilateral 07/09/2018   Procedure: LAPAROSCOPIC BILATERAL SALPINGO OOPHORECTOMY, cystocopy;  Surgeon: Merryl Abraham, MD;  Location: [facility];  Service: Gynecology;  Laterality: Bilateral;   SALPINGOOPHORECTOMY Bilateral 06/2018   SPLENECTOMY, TOTAL  1996   TOTAL HIP ARTHROPLASTY Right 12/03/2017   Procedure: RIGHT TOTAL HIP ARTHROPLASTY ANTERIOR APPROACH;  Surgeon: Liliane Rei, MD;  Location: [facility];  Service: Orthopedics;  Laterality: Right;   TOTAL HIP ARTHROPLASTY Left 09/28/2018    Procedure: LEFT TOTAL HIP ARTHROPLASTY ANTERIOR APPROACH;  Surgeon: Liliane Rei, MD;  Location: [facility];  Service: Orthopedics;  Laterality: Left;    TUBAL LIGATION N/A 08/06/2018   Procedure: REPAIR OF INCISIONAL HERNIA;  Surgeon: Merryl Abraham, MD;  Location: [facility];  Service: Gynecology;  Laterality: N/A;   WISDOM TOOTH EXTRACTION      OB History   No obstetric history on file.      Home Medications    Prior to Admission medications   Medication Sig Start Date End Date Taking? Authorizing Provider  doxycycline (VIBRAMYCIN) 100 MG capsule Take 1 capsule (100 mg total) by mouth 2 (two) times daily for 10 days. 03/22/24 04/01/24 Yes Rachel Ramp, FNP  Multiple Vitamin (MULTIVITAMIN) tablet Take 1 tablet by mouth daily.   Yes [provider]  naproxen sodium (ALEVE) 220 MG tablet Take 220 mg by mouth daily.   Yes [provider]  OVER THE COUNTER MEDICATION Take 3 tablets by mouth daily. Instaflex Joint supplement   Yes [provider]  OVER THE COUNTER MEDICATION Apply 1 application topically daily as needed (for arthritis pain). Deep Freeze Cream   Yes [provider]  Polyethyl Glycol-Propyl Glycol (SYSTANE) 0.4-0.3 % SOLN Place 1 drop into both eyes 3 (three) times daily as needed (for dry eyes).    Yes [provider]  nitroGLYCERIN  (NITROSTAT ) 0.4 MG SL tablet Place 0.4 mg under the tongue every 5 (  five) minutes as needed. 03/03/23   [provider]    Family History Family History  Problem Relation Age of Onset   Gallstones Mother    High Cholesterol Father    Kidney Stones Father    Cancer Maternal Uncle    Ovarian cancer Maternal Grandmother    Bone cancer Maternal Grandfather    Leukemia Maternal Grandfather    Heart disease Paternal Grandmother    Arthritis Paternal Grandmother     Social History Social History   Tobacco Use   Smoking status: Former    Current packs/day: 0.25    Average packs/day: 0.3  packs/day for 8.0 years (2.0 ttl pk-yrs)    Types: Cigarettes   Smokeless tobacco: Never   Tobacco comments:    quit at 26   Vaping Use   Vaping status: Never Used  Substance Use Topics   Alcohol use: No   Drug use: No     Allergies   Povidone iodine   Review of Systems Review of Systems   Physical Exam Triage Vital Signs ED Triage Vitals  Encounter Vitals Group     BP      Systolic BP Percentile      Diastolic BP Percentile      Pulse      Resp      Temp      Temp src      SpO2      Weight      Height      Head Circumference      Peak Flow      Pain Score      Pain Loc      Pain Education      Exclude from Growth Chart    No data found.  Updated Vital Signs BP (!) 170/80 (BP Location: Right Arm)   Pulse 95   Temp 98.6 F (37 C) (Oral)   Resp 18   LMP  (LMP Unknown)   SpO2 93%   Physical Exam Vitals and nursing note reviewed.  Constitutional:      General: She is not in acute distress.    Appearance: Normal appearance. She is normal weight. She is not ill-appearing.  HENT:     Head: Normocephalic and atraumatic.     Mouth/Throat:     Mouth: Mucous membranes are moist.     Pharynx: Oropharynx is clear.  Eyes:     Extraocular Movements: Extraocular movements intact.     Conjunctiva/sclera: Conjunctivae normal.     Pupils: Pupils are equal, round, and reactive to light.  Cardiovascular:     Rate and Rhythm: Normal rate and regular rhythm.     Pulses: Normal pulses.     Heart sounds: Normal heart sounds.  Pulmonary:     Effort: Pulmonary effort is normal.     Breath sounds: Normal breath sounds. No wheezing, rhonchi or rales.  Musculoskeletal:        General: Normal range of motion.  Skin:    General: Skin is warm and dry.     Comments: Right heel: Visible puncture wound noted with surrounding erythema and moderate soft tissue swelling please see image below  Neurological:     General: No focal deficit present.     Mental Status: She is  alert and oriented to person, place, and time. Mental status is at baseline.  Psychiatric:        Mood and Affect: Mood normal.        Behavior: Behavior  normal.        Thought Content: Thought content normal.      UC Treatments / Results  Labs (all labs ordered are listed, but only abnormal results are displayed) Labs Reviewed - No data to display  EKG   Radiology No results found.  Procedures Procedures (including critical care time)  Medications Ordered in UC Medications  Tdap (BOOSTRIX) injection 0.5 mL (0.5 mLs Intramuscular Given 03/22/24 1322)    Initial Impression / Assessment and Plan / UC Course  I have reviewed the triage vital signs and the nursing notes.  Pertinent labs & imaging results that were available during my care of the patient were reviewed by me and considered in my medical decision making (see chart for details).     MDM: 1.  Puncture wound of right foot, initial encounter-Tdap (BOOSTRIX) 0.5 IM given once in clinic prior to discharge; 2.  Cellulitis of right heel-Rx'd doxycycline 100 mg capsule: Take 1 capsule twice daily x 10 days. Patient to take medication as directed with food to completion.  Encouraged increase daily water  intake to 64 ounces per day while taking his medication.  Advised if symptoms worsen and/or unresolved please follow-up with your PCP or here for further evaluation.  Patient discharged home, hemodynamically stable. Final Clinical Impressions(s) / UC Diagnoses   Final diagnoses:  Puncture wound of right foot, initial encounter  Cellulitis of right heel     Discharge Instructions      Patient to take medication as directed with food to completion.  Encouraged increase daily water  intake to 64 ounces per day while taking his medication.  Advised if symptoms worsen and/or unresolved please follow-up with your PCP or here for further evaluation.     ED Prescriptions     Medication Sig Dispense Auth. Provider    doxycycline (VIBRAMYCIN) 100 MG capsule Take 1 capsule (100 mg total) by mouth 2 (two) times daily for 10 days. 20 capsule Madilyn Cephas, FNP      PDMP not reviewed this encounter.   Rachel Ramp, FNP 03/22/24 1341

## 2024-03-22 NOTE — Discharge Instructions (Addendum)
 Patient to take medication as directed with food to completion.  Encouraged increase daily water  intake to 64 ounces per day while taking his medication.  Advised if symptoms worsen and/or unresolved please follow-up with your PCP or here for further evaluation.

## 2024-03-22 NOTE — ED Triage Notes (Signed)
 Patient states that she stepped on something in the yard x 2 days.  The object went right through her shoe, puncturing her right heel.  The area is red, hot, swollen and painful.  Patient has not had a Tdap in 25 years.
